# Patient Record
Sex: Female | Born: 1965 | Race: White | Hispanic: No | Marital: Married | Smoking: Current every day smoker
Health system: Southern US, Community
[De-identification: ages and names within clinical notes are randomized; demographics above are authoritative.]

## PROBLEM LIST (undated history)

## (undated) DIAGNOSIS — F419 Anxiety disorder, unspecified: Secondary | ICD-10-CM

## (undated) DIAGNOSIS — E0590 Thyrotoxicosis, unspecified without thyrotoxic crisis or storm: Secondary | ICD-10-CM

## (undated) DIAGNOSIS — K859 Acute pancreatitis without necrosis or infection, unspecified: Secondary | ICD-10-CM

## (undated) DIAGNOSIS — I10 Essential (primary) hypertension: Secondary | ICD-10-CM

## (undated) DIAGNOSIS — E7800 Pure hypercholesterolemia, unspecified: Secondary | ICD-10-CM

## (undated) DIAGNOSIS — E079 Disorder of thyroid, unspecified: Secondary | ICD-10-CM

## (undated) DIAGNOSIS — R011 Cardiac murmur, unspecified: Secondary | ICD-10-CM

## (undated) DIAGNOSIS — G4730 Sleep apnea, unspecified: Secondary | ICD-10-CM

## (undated) DIAGNOSIS — C569 Malignant neoplasm of unspecified ovary: Secondary | ICD-10-CM

## (undated) DIAGNOSIS — I6529 Occlusion and stenosis of unspecified carotid artery: Secondary | ICD-10-CM

## (undated) DIAGNOSIS — R739 Hyperglycemia, unspecified: Secondary | ICD-10-CM

## (undated) HISTORY — PX: ABDOMINAL HYSTERECTOMY: SHX81

## (undated) HISTORY — PX: ABSCESS DRAINAGE: SHX1119

---

## 1989-06-09 DIAGNOSIS — C569 Malignant neoplasm of unspecified ovary: Secondary | ICD-10-CM

## 1989-06-09 HISTORY — DX: Malignant neoplasm of unspecified ovary: C56.9

## 2005-02-13 ENCOUNTER — Emergency Department: Payer: Self-pay | Admitting: Emergency Medicine

## 2005-02-13 ENCOUNTER — Other Ambulatory Visit: Payer: Self-pay

## 2005-02-21 ENCOUNTER — Ambulatory Visit: Payer: Self-pay | Admitting: Internal Medicine

## 2005-12-05 ENCOUNTER — Other Ambulatory Visit: Payer: Self-pay

## 2005-12-12 ENCOUNTER — Ambulatory Visit: Payer: Self-pay | Admitting: Obstetrics and Gynecology

## 2005-12-15 ENCOUNTER — Emergency Department: Payer: Self-pay | Admitting: Obstetrics and Gynecology

## 2006-01-12 ENCOUNTER — Ambulatory Visit: Payer: Self-pay | Admitting: Obstetrics and Gynecology

## 2006-10-11 ENCOUNTER — Emergency Department: Payer: Self-pay | Admitting: Emergency Medicine

## 2006-11-21 ENCOUNTER — Ambulatory Visit: Payer: Self-pay | Admitting: Emergency Medicine

## 2007-01-20 ENCOUNTER — Ambulatory Visit: Payer: Self-pay | Admitting: Emergency Medicine

## 2008-04-02 ENCOUNTER — Ambulatory Visit: Payer: Self-pay | Admitting: Family Medicine

## 2009-11-30 ENCOUNTER — Emergency Department: Payer: Self-pay | Admitting: Internal Medicine

## 2010-06-13 ENCOUNTER — Ambulatory Visit: Payer: Self-pay | Admitting: Internal Medicine

## 2010-11-22 ENCOUNTER — Ambulatory Visit: Payer: Self-pay | Admitting: Cardiology

## 2010-12-20 DIAGNOSIS — I10 Essential (primary) hypertension: Secondary | ICD-10-CM | POA: Insufficient documentation

## 2010-12-20 DIAGNOSIS — E785 Hyperlipidemia, unspecified: Secondary | ICD-10-CM | POA: Insufficient documentation

## 2010-12-20 DIAGNOSIS — Z8543 Personal history of malignant neoplasm of ovary: Secondary | ICD-10-CM | POA: Insufficient documentation

## 2010-12-20 DIAGNOSIS — N6009 Solitary cyst of unspecified breast: Secondary | ICD-10-CM | POA: Insufficient documentation

## 2011-05-06 ENCOUNTER — Emergency Department: Payer: Self-pay | Admitting: Emergency Medicine

## 2011-05-15 ENCOUNTER — Ambulatory Visit: Payer: Self-pay | Admitting: Cardiology

## 2011-05-26 ENCOUNTER — Ambulatory Visit: Payer: Self-pay

## 2011-06-18 ENCOUNTER — Ambulatory Visit: Payer: Self-pay

## 2011-07-15 DIAGNOSIS — E039 Hypothyroidism, unspecified: Secondary | ICD-10-CM | POA: Insufficient documentation

## 2011-07-30 ENCOUNTER — Encounter (HOSPITAL_COMMUNITY): Payer: Self-pay | Admitting: *Deleted

## 2011-07-30 ENCOUNTER — Encounter (HOSPITAL_COMMUNITY): Payer: Self-pay | Admitting: Emergency Medicine

## 2011-07-30 ENCOUNTER — Emergency Department (HOSPITAL_COMMUNITY)
Admission: EM | Admit: 2011-07-30 | Discharge: 2011-07-30 | Disposition: A | Discharge status: Other Acute Inpatient Hospital | Payer: 59 | Attending: Emergency Medicine | Admitting: Emergency Medicine

## 2011-07-30 ENCOUNTER — Inpatient Hospital Stay (HOSPITAL_COMMUNITY)
Admission: RE | Admit: 2011-07-30 | Discharge: 2011-08-01 | DRG: 885 | Disposition: A | Discharge status: Home / Self Care | Payer: 59 | Attending: Psychiatry | Admitting: Psychiatry

## 2011-07-30 DIAGNOSIS — F419 Anxiety disorder, unspecified: Secondary | ICD-10-CM

## 2011-07-30 DIAGNOSIS — F411 Generalized anxiety disorder: Secondary | ICD-10-CM | POA: Insufficient documentation

## 2011-07-30 DIAGNOSIS — I6529 Occlusion and stenosis of unspecified carotid artery: Secondary | ICD-10-CM | POA: Insufficient documentation

## 2011-07-30 DIAGNOSIS — G4730 Sleep apnea, unspecified: Secondary | ICD-10-CM

## 2011-07-30 DIAGNOSIS — F329 Major depressive disorder, single episode, unspecified: Secondary | ICD-10-CM

## 2011-07-30 DIAGNOSIS — I10 Essential (primary) hypertension: Secondary | ICD-10-CM | POA: Insufficient documentation

## 2011-07-30 DIAGNOSIS — R45851 Suicidal ideations: Secondary | ICD-10-CM | POA: Insufficient documentation

## 2011-07-30 DIAGNOSIS — E0590 Thyrotoxicosis, unspecified without thyrotoxic crisis or storm: Secondary | ICD-10-CM

## 2011-07-30 DIAGNOSIS — E079 Disorder of thyroid, unspecified: Secondary | ICD-10-CM | POA: Insufficient documentation

## 2011-07-30 DIAGNOSIS — Z79899 Other long term (current) drug therapy: Secondary | ICD-10-CM

## 2011-07-30 DIAGNOSIS — F339 Major depressive disorder, recurrent, unspecified: Secondary | ICD-10-CM | POA: Diagnosis present

## 2011-07-30 DIAGNOSIS — F341 Dysthymic disorder: Secondary | ICD-10-CM | POA: Insufficient documentation

## 2011-07-30 DIAGNOSIS — F332 Major depressive disorder, recurrent severe without psychotic features: Principal | ICD-10-CM

## 2011-07-30 DIAGNOSIS — Z859 Personal history of malignant neoplasm, unspecified: Secondary | ICD-10-CM

## 2011-07-30 HISTORY — DX: Essential (primary) hypertension: I10

## 2011-07-30 HISTORY — DX: Cardiac murmur, unspecified: R01.1

## 2011-07-30 HISTORY — DX: Sleep apnea, unspecified: G47.30

## 2011-07-30 HISTORY — DX: Occlusion and stenosis of unspecified carotid artery: I65.29

## 2011-07-30 HISTORY — DX: Anxiety disorder, unspecified: F41.9

## 2011-07-30 HISTORY — DX: Thyrotoxicosis, unspecified without thyrotoxic crisis or storm: E05.90

## 2011-07-30 HISTORY — DX: Disorder of thyroid, unspecified: E07.9

## 2011-07-30 LAB — COMPREHENSIVE METABOLIC PANEL
AST: 25 U/L (ref 0–37)
BUN: 12 mg/dL (ref 6–23)
CO2: 29 mEq/L (ref 19–32)
Calcium: 9.7 mg/dL (ref 8.4–10.5)
Chloride: 101 mEq/L (ref 96–112)
Creatinine, Ser: 0.69 mg/dL (ref 0.50–1.10)
GFR calc Af Amer: >90 mL/min (ref >90)
GFR calc non Af Amer: >90 mL/min (ref >90)
Total Bilirubin: 0.2 mg/dL — ABNORMAL LOW (ref 0.3–1.2)

## 2011-07-30 LAB — CBC
HCT: 43.2 % (ref 36.0–46.0)
MCH: 32 pg (ref 26.0–34.0)
MCV: 92.1 fL (ref 78.0–100.0)
Platelets: 228 10*3/uL (ref 150–400)
RBC: 4.69 MIL/uL (ref 3.87–5.11)
RDW: 13.2 % (ref 11.5–15.5)

## 2011-07-30 LAB — URINALYSIS, ROUTINE W REFLEX MICROSCOPIC
Bilirubin Urine: NEGATIVE
Glucose, UA: NEGATIVE mg/dL
Hgb urine dipstick: NEGATIVE
Specific Gravity, Urine: 1.025 (ref 1.005–1.030)
pH: 6 (ref 5.0–8.0)

## 2011-07-30 LAB — RAPID URINE DRUG SCREEN, HOSP PERFORMED
Cocaine: NOT DETECTED
Opiates: NOT DETECTED
Tetrahydrocannabinol: NOT DETECTED

## 2011-07-30 LAB — ETHANOL: Alcohol, Ethyl (B): <11 mg/dL (ref 0–11)

## 2011-07-30 LAB — ACETAMINOPHEN LEVEL: Acetaminophen (Tylenol), Serum: <15 ug/mL (ref 10–30)

## 2011-07-30 MED ORDER — LEVOTHYROXINE SODIUM 25 MCG PO TABS
25.0000 ug | ORAL_TABLET | Freq: Every day | ORAL | Status: DC
  Administered 2011-07-31 – 2011-08-01 (×2): 25 ug via ORAL
  Filled 2011-07-30 (×4): qty 1

## 2011-07-30 MED ORDER — LORAZEPAM 0.5 MG PO TABS
0.5000 mg | ORAL_TABLET | Freq: Once | ORAL | Status: AC
  Administered 2011-07-30: 0.5 mg via ORAL
  Filled 2011-07-30: qty 1

## 2011-07-30 MED ORDER — MAGNESIUM HYDROXIDE 400 MG/5ML PO SUSP: 30.0000 mL | Freq: Every day | ORAL | Status: DC | PRN

## 2011-07-30 MED ORDER — ALUM & MAG HYDROXIDE-SIMETH 200-200-20 MG/5ML PO SUSP: 30.0000 mL | ORAL | Status: DC | PRN

## 2011-07-30 MED ORDER — CHOLECALCIFEROL 10 MCG (400 UNIT) PO TABS
800.0000 [IU] | ORAL_TABLET | Freq: Every day | ORAL | Status: DC
  Administered 2011-07-31 – 2011-08-01 (×2): 800 [IU] via ORAL
  Filled 2011-07-30 (×4): qty 2

## 2011-07-30 MED ORDER — LISINOPRIL 20 MG PO TABS
20.0000 mg | ORAL_TABLET | Freq: Every day | ORAL | Status: DC
  Administered 2011-07-31 – 2011-08-01 (×2): 20 mg via ORAL
  Filled 2011-07-30 (×3): qty 1

## 2011-07-30 MED ORDER — ALPRAZOLAM 1 MG PO TABS: 1.0000 mg | ORAL_TABLET | Freq: Every day | ORAL | Status: DC | PRN

## 2011-07-30 MED ORDER — ESTRADIOL 0.075 MG/24HR TD PTWK: 0.0750 mg | MEDICATED_PATCH | TRANSDERMAL | Status: DC

## 2011-07-30 MED ORDER — METOPROLOL TARTRATE 25 MG PO TABS
25.0000 mg | ORAL_TABLET | Freq: Every day | ORAL | Status: DC
  Administered 2011-07-30 – 2011-07-31 (×2): 25 mg via ORAL
  Filled 2011-07-30 (×4): qty 1

## 2011-07-30 MED ORDER — ACETAMINOPHEN 325 MG PO TABS: 650.0000 mg | ORAL_TABLET | Freq: Four times a day (QID) | ORAL | Status: DC | PRN

## 2011-07-30 NOTE — Tx Team (Signed)
Initial Interdisciplinary Treatment Plan  PATIENT STRENGTHS: (choose at least two) Ability for insight Active sense of humor Average or above average intelligence Capable of independent living General fund of knowledge Motivation for treatment/growth Physical Health Religious Affiliation Supportive family/friends Work skills  PATIENT STRESSORS: Health problems Marital or family conflict Medication change or noncompliance   PROBLEM LIST: Problem List/Patient Goals Date to be addressed Date deferred Reason deferred Estimated date of resolution  ""Learning to let my kids be on their own" 07/30/11     "Cope with fact that daughter is gay" 07/30/11     "Learning Mercado to cope with stress"            Increased risk for suicide 07/30/11     Depression                         DISCHARGE CRITERIA:  Ability to meet basic life and health needs Adequate post-discharge living arrangements Improved stabilization in mood, thinking, and/or behavior Motivation to continue treatment in a less acute level of care Need for constant or close observation no longer present Safe-care adequate arrangements made Verbal commitment to aftercare and medication compliance  PRELIMINARY DISCHARGE PLAN: Attend aftercare/continuing care group Outpatient therapy Participate in family therapy Return to previous living arrangement Return to previous work or school arrangements  PATIENT/FAMIILY INVOLVEMENT: This treatment plan has been presented to and reviewed with the patient, Erin Mercado, and/or family member.  The patient and family have been given the opportunity to ask questions and make suggestions.  Erin Mercado 07/30/2011, 8:15 PM
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
## 2011-07-30 NOTE — ED Notes (Signed)
Pt arrive to room 

## 2011-07-30 NOTE — ED Notes (Signed)
States she thinks she is having panic attacks, increased depression, tried Prozac did not work for her so she stopped it, takes .25 Xanax BID, has 2 daughters, one has a daughter and the other is pregnant and this is worrying patient, all children and grandchildren are living wl/patient and husband.

## 2011-07-30 NOTE — ED Notes (Signed)
Vital signs stable. 
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
## 2011-07-30 NOTE — BH Assessment (Signed)
Assessment Note   Erin Mercado is an 46 y.o. female. PT PRESENTS WITH INCREASE DEPRESSION , SUICIDAL THOUGHTS, FEELINGS OF HOPELESSNESS, HELPLESSNESS, OVERWHELMNESS & SEVERAL ANXIETY. PT SAYS FOR THE PAST FEW MONTHS SHE HAS BEEN HAVING FREQUENT DIZZY & FAINT FEELING BUT WHEN SHE GOES TO THE ED, THEY ARE UNABLE TO FIND ANYTHING. PT STATES HER PCP FELT THAT IT WAS ANXIETY & PT NEEDED TO GET ON SOME MEDS. PT EXPRESSED THAT SHE WAS NERVIOUS ABOUT BEING PUT ON MEDS CAUSE SHE IS NOT SURE WHAT THE SIDE AFFECTS WILL BE. PT IS EMOTIONAL & TEARFUL. PT STATES SAID SHE WENT TO [facility] ON Saturday AS WELL AS Monday. PT SAID SHE HAD A STRONG SUICIDAL THOUGHT ON YESTERDAY & WANTED TO GO HOME  TO SHOOT SELF. PT TOLD SPOUSE THAT SHE FELT OUT OF CONTROL & CANT TAKE IT ANYMORE. PT SAYS HE MIND IS RACING & SHE DOES NOT WANT TO BE HERE ANYMORE. PT DENIES HI OR AV BUT IS UNABLE TO CONTRACT FOR SAFETY. PT IS HAVING TO HAVE SOMEONE STAY WITH HER AT ALL TIMES. PT WAS RAN BY DR. Dan Humphreys WHO ACCEPTED BUT WANTED PT MEDICALLY CLEARED BEFORE ADMISSION.   Axis I: Anxiety Disorder NOS and Major Depression, single episode Axis II: Deferred Axis III:  Past Medical History  Diagnosis Date  . Hypertension   . Heart murmur   . Carotid artery stenosis, unilateral   . Thyroid disease   . Anxiety   . Sleep apnea    Axis IV: other psychosocial or environmental problems and problems with access to health care services Axis V: 11-20 some danger of hurting self or others possible OR occasionally fails to maintain minimal personal hygiene OR gross impairment in communication  Past Medical History: No past medical history on file.  No past surgical history on file.  Family History: No family history on file.  Social History:  does not have a smoking history on file. She does not have any smokeless tobacco history on file. Her alcohol and drug histories not on file.  Additional Social History:    Allergies: Allergies not on  file  Home Medications:  No current facility-administered medications on file as of 07/30/2011.   No current outpatient prescriptions on file as of 07/30/2011.    OB/GYN Status:  No LMP recorded.  General Assessment Data Location of Assessment: [facility] Assessment Services Living Arrangements: Spouse/significant other;Children;Relatives Can pt return to current living arrangement?: Yes Admission Status: Voluntary Is patient capable of signing voluntary admission?: Yes Transfer from: Home Referral Source: Self/Family/Friend     Risk to self Suicidal Ideation: Yes-Currently Present Suicidal Intent: Yes-Currently Present Is patient at risk for suicide?: Yes Suicidal Plan?: Yes-Currently Present Specify Current Suicidal Plan: TO SHOOT SELF Access to Means: Yes Specify Access to Suicidal Means: GUN What has been your use of drugs/alcohol within the last 12 months?: NA Previous Attempts/Gestures: No How many times?: 0  Other Self Harm Risks: 0 Triggers for Past Attempts: Unpredictable Intentional Self Injurious Behavior: None Family Suicide History: Unknown Recent stressful life event(s): Turmoil (Comment) Persecutory voices/beliefs?: No Depression: Yes Depression Symptoms: Loss of interest in usual pleasures;Tearfulness;Feeling worthless/self pity Substance abuse history and/or treatment for substance abuse?: No Suicide prevention information given to non-admitted patients: Not applicable  Risk to Others Homicidal Ideation: No Thoughts of Harm to Others: No Current Homicidal Intent: No Current Homicidal Plan: No Access to Homicidal Means: No Identified Victim: NA History of harm to others?: No Assessment of Violence: None Noted Violent Behavior  Description: COOPERATIVE, ANXIOUS, TEARFUL EMOTIONAL  Does patient have access to weapons?: No Criminal Charges Pending?: No Does patient have a court date: No  Psychosis Hallucinations: None noted Delusions: None noted  Mental  Status Report Appear/Hygiene: Improved Eye Contact: Good Motor Activity: Freedom of movement Speech: Logical/coherent Level of Consciousness: Alert;Crying Mood: Depressed;Anxious;Anhedonia;Helpless;Sad Affect: Appropriate to circumstance;Depressed;Sad;Anxious;Apprehensive Anxiety Level: Moderate Thought Processes: Coherent;Relevant Judgement: Unimpaired Orientation: Person;Place;Time;Situation Obsessive Compulsive Thoughts/Behaviors: None  Cognitive Functioning Concentration: Decreased Memory: Recent Intact;Remote Intact IQ: Average Insight: Poor Impulse Control: Poor Appetite: Fair Weight Loss: 0  Weight Gain: 0  Sleep: Decreased Total Hours of Sleep: 4  Vegetative Symptoms: None  Prior Inpatient Therapy Prior Inpatient Therapy: No Prior Therapy Dates: NA Prior Therapy Facilty/Provider(s): NA Reason for Treatment: NA  Prior Outpatient Therapy Prior Outpatient Therapy: No Prior Therapy Dates: NA Prior Therapy Facilty/Provider(s): NA Reason for Treatment: NA                     Additional Information 1:1 In Past 12 Months?: No CIRT Risk: No Elopement Risk: No Does patient have medical clearance?: No     Disposition:  Disposition Disposition of Patient: Inpatient treatment program;Referred to (PT WAS ACCEPTED BY DR. Elba Barman PENDING MEDICAL CLEARANCE) Type of inpatient treatment program: Adult  On Site Evaluation by:   Reviewed with Physician:     Waldron Session 07/30/2011 1:47 PM

## 2011-07-30 NOTE — ED Notes (Signed)
Patient denies Si or HI at this time, does not want to go to [facility] or any place w/men and women. Wants to go to a facility for women only, informed that place did not exist, informed [facility] member about this information.

## 2011-07-31 DIAGNOSIS — F339 Major depressive disorder, recurrent, unspecified: Secondary | ICD-10-CM | POA: Diagnosis present

## 2011-07-31 DIAGNOSIS — F332 Major depressive disorder, recurrent severe without psychotic features: Principal | ICD-10-CM

## 2011-07-31 MED ORDER — HYDROXYZINE HCL 25 MG PO TABS: 25.0000 mg | ORAL_TABLET | Freq: Three times a day (TID) | ORAL | Status: DC | PRN

## 2011-07-31 NOTE — H&P (Signed)
Medical/psychiatric screening examination/treatment/procedure(s) were performed by non-physician practitioner and as supervising physician I was immediately available for consultation/collaboration.   I have seen and examined this patient and agree with this evaluation.  

## 2011-07-31 NOTE — Tx Team (Signed)
Interdisciplinary Treatment Plan Update (Adult)  Date:  07/31/2011  Time Reviewed:  10:50 AM   Progress in Treatment: Attending groups: Yes Participating in groups:  Yes Taking medication as prescribed: Yes Tolerating medication:  Yes Family/Significant othe contact made:  Counselor assessing for appropriate contact Patient understands diagnosis:  Yes Discussing patient identified problems/goals with staff:  Yes Medical problems stabilized or resolved:  Yes Denies suicidal/homicidal ideation: Yes Issues/concerns per patient self-inventory:  None identified Other: N/A  New problem(s) identified: None Identified  Reason for Continuation of Hospitalization: Anxiety Depression Medication stabilization Suicidal ideation  Interventions implemented related to continuation of hospitalization: mood stabilization, medication monitoring and adjustment, group therapy and psycho education, safety checks q 15 mins  Additional comments: N/A  Estimated length of stay: 3-5 days  Discharge Plan: SW will seek appropriate referrals in [facility] for medication management and therapy  New goal(s): N/A  Review of initial/current patient goals per problem list:    1.  Goal(s): Reduce depressive symptoms  Met:  No  Target date: by discharge  As evidenced by: Reducing depression from a 10 to a 3 as reported by pt.   2.  Goal (s): Reduce/Eliminate suicidal ideation  Met:  No  Target date: by discharge  As evidenced by: pt reporting no SI.    3.  Goal(s): Reduce anxiety symptoms  Met:  No  Target date: by discharge  As evidenced by: Reduce anxiety from a 10 to a 3 as reported by pt.    Attendees: Patient:  Erin Mercado 07/31/2011 10:51 AM   Family:     Physician:  Orson Aloe, MD  07/31/2011  10:50 AM   Nursing:   Omelia Blackwater, RN 07/31/2011 10:51 AM   Case Manager:  Reyes Ivan, LCSWA 07/31/2011  10:50 AM   Counselor:  Angus Palms, LCSW 07/31/2011  10:50 AM     Other:  Juline Patch, LCSW 07/31/2011  10:50 AM   Other:  Quintella Reichert, RN 07/31/2011  10:50 AM   Other:     Other:      Scribe for Treatment Team:   Carmina Miller, 07/31/2011 , 10:50 AM

## 2011-07-31 NOTE — Progress Notes (Signed)
Patient was pleasant and cooperative during the assessment. Pt states both her daughters ages 38 and 45 are living with her and her husband. The 18yr  Just informed her mother that she believes she's gay. She has a small child and the 41yr is expecting. Pt was started on prozac last week, but states she stopped taking them due to elevated Bp's. States she's been having "dizzy spells" off/on for several years. States, "really bad for the last 4 months".  States she's had neuro consults and all were good. States Dr believes it was anxiety or stress. Support and encouragement was offered.
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
## 2011-07-31 NOTE — Progress Notes (Signed)
Patient's self inventory sheet, patient sleeps fair, has improving appetite, low energy level, good attention span.   Rated depression and hopelessness #5.  Denied SI.  Has experienced lightheadedness and headaches in past 24 hours.  Zero pain goal, worst pain #1.  Plans to do more for herself after discharged instead of others all the time.  No discharge plans.  No problems taking meds after discharge. Has been cooperative and pleasant this morning.

## 2011-07-31 NOTE — Progress Notes (Signed)
Pt attended discharge planning group and actively participated.  Pt presents with flat affect and depressed mood.  Pt was open with sharing reason for entering the hospital.  Pt states that she was overwhelmed with caring for her family.  Pt states that she has an 23 and 44 year old living with her, a 10 year old grandchild and a baby on the way.  Pt states that she also found out her 44 year old may be a lesbian.  Pt states she lives with her family in [place] and has transportation home.  Pt states that she was having a lot of physical symptoms, such as dizziness and couldn't figure out what was wrong.  Pt states that she has been checked out and nothing is found.  Pt states that her husband was concerned about her suicidal thoughts and brought her here.  Pt states that she now realizes her symptoms are anxiety.  Pt ranks depression at a 5 and anxiety at an 8 today.  Pt denies SI today.  Pt states that she has not been treated for depression or anxiety before.  Pt open to referrals.  SW will seek appropriate referrals for pt.  Safety planning and suicide prevention discussed.     Reyes Ivan, LCSWA 07/31/2011  10:38 AM
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
## 2011-08-01 LAB — TSH: TSH: 2.441 u[IU]/mL (ref 0.350–4.500)

## 2011-08-01 LAB — T3, FREE: T3, Free: 3.1 pg/mL (ref 2.3–4.2)

## 2011-08-01 LAB — T4, FREE: Free T4: 1.44 ng/dL (ref 0.80–1.80)

## 2011-08-01 MED ORDER — ESTRADIOL 0.075 MG/24HR TD PTWK: 1.0000 | MEDICATED_PATCH | TRANSDERMAL | Status: AC

## 2011-08-01 MED ORDER — VITAMIN D 400 UNITS PO CAPS: 800.0000 [IU] | ORAL_CAPSULE | Freq: Every day | ORAL | Status: DC

## 2011-08-01 MED ORDER — METOPROLOL TARTRATE 25 MG PO TABS: 25.0000 mg | ORAL_TABLET | Freq: Every day | ORAL | Status: DC

## 2011-08-01 MED ORDER — HYDROXYZINE HCL 25 MG PO TABS: 25.0000 mg | ORAL_TABLET | Freq: Three times a day (TID) | ORAL | Status: AC | PRN

## 2011-08-01 MED ORDER — LEVOTHYROXINE SODIUM 25 MCG PO TABS: 25.0000 ug | ORAL_TABLET | Freq: Every day | ORAL | Status: DC

## 2011-08-01 MED ORDER — LISINOPRIL 20 MG PO TABS: 20.0000 mg | ORAL_TABLET | Freq: Every day | ORAL | Status: DC

## 2011-08-01 NOTE — Progress Notes (Signed)
Patient ID: Erin Mercado, female   DOB: 08/30/1965, 46 y.o.   MRN: [id number] Patient has order for d/c and denies any SI. Verbalizes understanding of follow up and medications. Appears ready to d/c.

## 2011-08-01 NOTE — Progress Notes (Signed)
[facility] Group Notes:  (Counselor/Nursing/MHT/Case Management/Adjunct)  08/01/2011 11:00AM  Type of Therapy:  Group Therapy  Participation Level:  Active  Participation Quality:  Appropriate, Attentive, Sharing and Supportive  Affect:  Appropriate  Cognitive:  Alert and Oriented  Insight:  Good  Engagement in Group:  Good  Engagement in Therapy:  Good  Modes of Intervention:  Clarification, Problem-solving, Support and Exploratory  Summary of Progress/Problems: Pt was able to discuss the two emotions that she has been experiencing which are stress and anxiety. Pt stated that one of her triggers for her stress and anxiety are her children. Pt was able to recognize that she needs to learn to say no and to communicate more. Pt reports that her husband is very supportive at this time. Pt stated her plan is to learn how to communicate more with her family.  Liliann File, Randal Buba 08/01/2011, 12:19 PM

## 2011-08-01 NOTE — [facility] Suicide Risk Assessment (Signed)
Suicide Risk Assessment  Discharge Assessment     Demographic factors:  Assessment Details Time of Assessment: Admission Information Obtained From: Patient Current Mental Status:    Risk Reduction Factors:  Risk Reduction Factors: Responsible for children under 44 years of age;Sense of responsibility to family;Religious beliefs about death;Living with another person, especially a relative;Positive social support;Positive therapeutic relationship;Positive coping skills or problem solving skills  CLINICAL FACTORS:   Severe Anxiety and/or Agitation Previous Psychiatric Diagnoses and Treatments  COGNITIVE FEATURES THAT CONTRIBUTE TO RISK:  Thought constriction (tunnel vision)    SUICIDE RISK:   Minimal: No identifiable suicidal ideation.  Patients presenting with no risk factors but with morbid ruminations; may be classified as minimal risk based on the severity of the depressive symptoms  ADL's:  Intact  Sleep: Good  Appetite:  Good  Suicidal Ideation:  Denies adamantly any suicidal thoughts. Homicidal Ideation:  Denies adamantly any homicidal thoughts.  Mental Status Examination/Evaluation: Objective:  Appearance: Casual  Eye Contact::  Good  Speech:  Clear and Coherent  Volume:  Normal  Mood:  Euthymic  Affect:  Congruent  Thought Process:  Coherent  Orientation:  Full  Thought Content:  WDL  Suicidal Thoughts:  No  Homicidal Thoughts:  No  Memory:  Immediate;   Good  Judgement:  Good  Insight:  Good  Psychomotor Activity:  Normal  Concentration:  Good  Recall:  Good  Akathisia:  No  AIMS (if indicated):     Assets:  Communication Skills Desire for Improvement Financial Resources/Insurance  Sleep:     Vital Signs: Blood pressure 116/79, pulse 62, temperature 97.7 F (36.5 C), temperature source Oral, resp. rate 16, last menstrual period 07/29/2001.  Labs Results for orders placed during the hospital encounter of 07/30/11 (from the past 48 hour(s))  T3, FREE      Status: Normal   Collection Time   07/31/11  8:09 PM      Component Value Range Comment   T3, Free 3.1  2.3 - 4.2 (pg/mL)   T4, FREE     Status: Normal   Collection Time   07/31/11  8:09 PM      Component Value Range Comment   Free T4 1.44  0.80 - 1.80 (ng/dL)   TSH     Status: Normal   Collection Time   07/31/11  8:09 PM      Component Value Range Comment   TSH 2.441  0.350 - 4.500 (uIU/mL)     What pt has learned from hospital stay is learning to accept that I do have a problem.  Risk of self harm is minimal in that she has learned that she needs to change and then be stronger.  She still sees herself and her husband as reason to live.  Risk of harm to others is minimal in that she has not been involved in fights or had any legal charges filed on her.  PLAN: Discharge home Continue Medication List  As of 08/01/2011 10:31 AM   STOP taking these medications         ALPRAZolam 0.5 MG tablet      metoprolol succinate 50 MG 24 hr tablet         TAKE these medications         estradiol 0.075 mg/24hr   Commonly known as: CLIMARA - Dosed in mg/24 hr   Place 1 patch (0.075 mg total) onto the skin once a week. For estrogen replacement. SATURDAY  hydrOXYzine 25 MG tablet   Commonly known as: ATARAX/VISTARIL   Take 1 tablet (25 mg total) by mouth 3 (three) times daily as needed for anxiety.      levothyroxine 25 MCG tablet   Commonly known as: SYNTHROID, LEVOTHROID   Take 1 tablet (25 mcg total) by mouth daily. For thyroid replacement      lisinopril 20 MG tablet   Commonly known as: PRINIVIL,ZESTRIL   Take 1 tablet (20 mg total) by mouth daily. For control of high blood pressure      metoprolol tartrate 25 MG tablet   Commonly known as: LOPRESSOR   Take 1 tablet (25 mg total) by mouth at bedtime. For control of high blood pressure      Vitamin D 400 UNITS capsule   Take 2 capsules (800 Units total) by mouth daily. For Vitamin D replacement and mood control.             Tyara Dassow 08/01/2011, 10:31 AM

## 2011-08-01 NOTE — Tx Team (Signed)
Interdisciplinary Treatment Plan Update (Adult)  Date:  08/01/2011  Time Reviewed:  10:36 AM   Progress in Treatment: Attending groups: Yes Participating in groups:  Yes Taking medication as prescribed: Yes Tolerating medication:  Yes Family/Significant othe contact made: Counselor will make contact with pt's husband today   Patient understands diagnosis:  Yes Discussing patient identified problems/goals with staff:  Yes Medical problems stabilized or resolved:  Yes Denies suicidal/homicidal ideation: Yes Issues/concerns per patient self-inventory:  None identified Other: N/A  New problem(s) identified: None Identified  Reason for Continuation of Hospitalization: Stable to d/c  Interventions implemented related to continuation of hospitalization: Stable to d/c  Additional comments: N/A  Estimated length of stay: D/C today  Discharge Plan: Pt will follow up at [facility] for medication management and therapy.    New goal(s): N/A  Review of initial/current patient goals per problem list:    1.  Goal(s): Reduce depressive symptoms  Met:  Yes  Target date: by discharge  As evidenced by: Reducing depression from a 10 to a 3 as reported by pt. Pt ranks at a 2 today.   2.  Goal (s): Reduce/Eliminate suicidal ideation  Met:  Yes  Target date: by discharge  As evidenced by: pt reporting no SI.    3.  Goal(s): Reduce anxiety symptoms  Met:  Yes  Target date: by discharge  As evidenced by: Reduce anxiety from a 10 to a 3 as reported by pt.  Pt ranks at a 2 today.    Attendees: Patient:  Erin Mercado 08/01/2011 10:38 AM   Family:     Physician:  Orson Aloe, MD  08/01/2011  10:36 AM   Nursing:   Omelia Blackwater, RN 08/01/2011 10:38 AM   Case Manager:  Reyes Ivan, LCSWA 08/01/2011  10:36 AM   Counselor:  Angus Palms, LCSW 08/01/2011  10:36 AM   Other:  Juline Patch, LCSW 08/01/2011  10:36 AM   Other:  Wilmon Arms, SW intern 08/01/2011  10:36 AM    Other:  Magdalen Spatz, counseling intern 08/01/2011 10:38 AM   Other:      Scribe for Treatment Team:   Carmina Miller, 08/01/2011 , 10:36 AM

## 2011-08-01 NOTE — Progress Notes (Signed)
[facility] Case Management Discharge Plan:  Will you be returning to the same living situation after discharge: Yes,  pt returning home At discharge, do you have transportation home?:Yes,  pt has transportation home Do you have the ability to pay for your medications:Yes,  access to meds   Release of information consent forms completed and in the chart;  Patient's signature needed at discharge.  Patient to Follow up at:  Follow-up Information    Follow up with [facility] - Rob Maggard for therapy on 08/05/2011. (Appointment scheduled at 3:00 pm (Arrive at 2:30 pm to  fill out paperwork))    Contact information:   [address] [place], [place] [address] Phone: [phone] Fax: [phone]      Follow up with [facility] - Dr. Terrilee Croak on 08/12/2011. (Appointment scheduled at 12:45 pm )    Contact information:   [address] [place], [place] [address] Phone: [phone] Fax: [phone]         Patient denies SI/HI:   Yes,  denies today    Safety Planning and Suicide Prevention discussed:  Yes,  discussed with pt  Barrier to discharge identified:No.  Summary and Recommendations: Pt attended discharge planning group and actively participated.  Pt presents with calm mood and affect.  Pt reports feeling stable to d/c today.  Pt ranks depression and anxiety at a 2 today.  Pt discussed setting boundaries and learning to tell her kids no.  Pt also discussed accepting that she has a mental disorder and it can be treated.  No recommendations from SW.  No further needs voiced by pt.  Pt stable to discharge.     Carmina Miller 08/01/2011, 12:16 PM

## 2011-08-01 NOTE — Progress Notes (Signed)
[facility] Adult Inpatient Family/Significant Other Suicide Prevention Education  Suicide Prevention Education:  Education Completed; Erin Mercado, husband, [phone]) has been identified by the patient as the family member/significant other with whom the patient will be residing, and identified as the person(s) who will aid the patient in the event of a mental health crisis (suicidal ideations/suicide attempt).  With written consent from the patient, the family member/significant other has been provided the following suicide prevention education, prior to the and/or following the discharge of the patient.  The suicide prevention education provided includes the following:  Suicide risk factors  Suicide prevention and interventions  National Suicide Hotline telephone number  [facility] assessment telephone number  [facility] Emergency Assistance 911  [facility] and/or Residential Mobile Crisis Unit telephone number  Request made of family/significant other to:  Remove weapons (e.g., guns, rifles, knives), all items previously/currently identified as safety concern.    Remove drugs/medications (over-the-counter, prescriptions, illicit drugs), all items previously/currently identified as a safety concern.  Erin Mercado reported that he does not have any safety concerns for Erin Mercado being discharged. He stated that he has removed all guns from the home, and she no longer has access to weapons. Erin Mercado verbalized understanding of suicide prevention information and had no further questions. He stated he is very interested in both [place] and the family getting counseling.   Erin Mercado 08/01/2011, 1:52 PM

## 2011-08-01 NOTE — Progress Notes (Signed)
Pt has been pleasant and cooperative this evening. Observed interacting well on the unit. Reports feeling mildly depressed this evening, but overall has decreased since admission. Pt attended Karaoke this evening and reported to the med window for meds this evening. Pt is currently denying any SI. Support and availability as needed has been extended to this pt. Pt safety remains with q68min checks.

## 2011-08-01 NOTE — Discharge Summary (Signed)
Physician Discharge Summary Note  Patient:  Erin Mercado is an 46 y.o., female MRN:  [id number] DOB:  Oct 08, 1965 Patient phone:  [phone] (home)  Patient address:   [address] [place] [address],   Date of Admission:  07/30/2011 Date of Discharge: 08/01/2011  Discharge Diagnoses: Principal Problem:  *Major depressive disorder, recurrent episode   Axis Diagnosis:   AXIS I:  Major Depression, Recurrent. AXIS II:  No diagnosis  AXIS III:  See below Past Medical History  Diagnosis Date  . Hypertension   . Heart murmur   . Carotid artery stenosis, unilateral   . Thyroid disease   . Anxiety   . Sleep apnea   . Hyperthyroidism   . Cancer    AXIS IV:  Chronic domestic and parenting stressors AXIS V:  51-60 moderate symptoms  Level of Care:  OP  Hospital Course:  This is the first admission for [facility] who presented in our emergency room complaining of suicidal thoughts and just feeling anxious. She reported being very overwhelmed by multiple family stressors, many people in the home or not contributing to helping household Ron. She told her spouse the day before that she felt out of control and had thoughts of going home and shooting herself. Get a history of chronic depression but no previous suicide attempts. Family history was positive for a grandfather who had attempted suicide.  She was admitted for mood disorders program, and started on Vistaril to control her anxiety and Xanax was discontinued. She was cooperative and pleasant on the unit and appropriate with peers and staff. Her group participation was satisfactory in our counselors were in touch with her family. By the 21st she was ready for discharge with no dangerous ideas and agreed to followup with outpatient counseling.  Consults:  None  Significant Diagnostic Studies:  TSH 2.441, Free T4 1.44.  UDS and alcohol screens negative.   Discharge Vitals:   Blood pressure 116/79, pulse 62, temperature 97.7 F  (36.5 C), temperature source Oral, resp. rate 16, last menstrual period 07/29/2001.  Mental Status Exam: See Mental Status Examination and Suicide Risk Assessment completed by Attending Physician prior to discharge.  Discharge destination:  Home  Is patient on multiple antipsychotic therapies at discharge:  No   Has Patient had three or more failed trials of antipsychotic monotherapy by history:  No  Recommended Plan for Multiple Antipsychotic Therapies: n/a   Medication List  As of 08/01/2011  1:14 PM   STOP taking these medications         ALPRAZolam 0.5 MG tablet      metoprolol succinate 50 MG 24 hr tablet         TAKE these medications      Indication    estradiol 0.075 mg/24hr   Commonly known as: CLIMARA - Dosed in mg/24 hr   Place 1 patch (0.075 mg total) onto the skin once a week. For estrogen replacement. SATURDAY       hydrOXYzine 25 MG tablet   Commonly known as: ATARAX/VISTARIL   Take 1 tablet (25 mg total) by mouth 3 (three) times daily as needed for anxiety.       levothyroxine 25 MCG tablet   Commonly known as: SYNTHROID, LEVOTHROID   Take 1 tablet (25 mcg total) by mouth daily. For thyroid replacement       lisinopril 20 MG tablet   Commonly known as: PRINIVIL,ZESTRIL   Take 1 tablet (20 mg total) by mouth daily. For control of  high blood pressure       metoprolol tartrate 25 MG tablet   Commonly known as: LOPRESSOR   Take 1 tablet (25 mg total) by mouth at bedtime. For control of high blood pressure       Vitamin D 400 UNITS capsule   Take 2 capsules (800 Units total) by mouth daily. For Vitamin D replacement and mood control.            Follow-up Information    Follow up with [facility] - Rob Maggard for therapy on 08/05/2011. (Appointment scheduled at 3:00 pm (Arrive at 2:30 pm to  fill out paperwork))    Contact information:   [address] [place], [place] [address] Phone: [phone] Fax: [phone]      Follow up with  [facility] - Dr. Terrilee Croak on 08/12/2011. (Appointment scheduled at 12:45 pm )    Contact information:   [address] [place], [place] [address] Phone: [phone] Fax: [phone]         Follow-up recommendations:  Activity:  unrestricted Diet:  regular  Signed: Lakia Gritton A 08/01/2011, 1:14 PM

## 2011-08-06 NOTE — Progress Notes (Signed)
Patient Discharge Instructions:  Admission Note Faxed,  08/06/2011 After Visit Summary Faxed,  08/06/2011 Faxed to the Next Level Care provider:  08/06/2011 D/C Summary Note faxed 08/06/2011 Facesheet faxed 08/06/2011  Fax unable to transmit after multiple attempts, sent via certified mail to [facility] @ [address] [place], [place] [address]  Wandra Scot, 08/06/2011, 11:02 AM

## 2011-08-06 NOTE — Discharge Summary (Signed)
I agree with this D/C Summary.  

## 2012-03-02 DIAGNOSIS — F419 Anxiety disorder, unspecified: Secondary | ICD-10-CM | POA: Insufficient documentation

## 2012-03-02 DIAGNOSIS — G4730 Sleep apnea, unspecified: Secondary | ICD-10-CM | POA: Insufficient documentation

## 2012-03-02 DIAGNOSIS — R011 Cardiac murmur, unspecified: Secondary | ICD-10-CM | POA: Insufficient documentation

## 2012-03-02 DIAGNOSIS — I499 Cardiac arrhythmia, unspecified: Secondary | ICD-10-CM | POA: Insufficient documentation

## 2012-03-02 DIAGNOSIS — E559 Vitamin D deficiency, unspecified: Secondary | ICD-10-CM | POA: Insufficient documentation

## 2012-03-02 DIAGNOSIS — L732 Hidradenitis suppurativa: Secondary | ICD-10-CM | POA: Insufficient documentation

## 2012-03-02 DIAGNOSIS — Z9114 Patient's other noncompliance with medication regimen: Secondary | ICD-10-CM | POA: Insufficient documentation

## 2012-03-02 DIAGNOSIS — Z8639 Personal history of other endocrine, nutritional and metabolic disease: Secondary | ICD-10-CM | POA: Insufficient documentation

## 2012-03-02 DIAGNOSIS — R0989 Other specified symptoms and signs involving the circulatory and respiratory systems: Secondary | ICD-10-CM | POA: Insufficient documentation

## 2012-06-11 ENCOUNTER — Ambulatory Visit: Payer: Self-pay

## 2012-07-12 ENCOUNTER — Ambulatory Visit: Payer: Self-pay | Admitting: Vascular Surgery

## 2013-07-12 ENCOUNTER — Ambulatory Visit: Payer: Self-pay | Admitting: Physician Assistant

## 2013-07-12 LAB — RAPID STREP-A WITH REFLX: MICRO TEXT REPORT: NEGATIVE

## 2013-07-15 LAB — BETA STREP CULTURE(ARMC)

## 2014-04-13 ENCOUNTER — Ambulatory Visit: Payer: Self-pay | Admitting: Family Medicine

## 2014-05-29 ENCOUNTER — Ambulatory Visit: Payer: Self-pay | Admitting: Physician Assistant

## 2014-05-29 LAB — RAPID STREP-A WITH REFLX: MICRO TEXT REPORT: NEGATIVE

## 2014-06-01 LAB — BETA STREP CULTURE(ARMC)

## 2014-09-24 DIAGNOSIS — N2889 Other specified disorders of kidney and ureter: Secondary | ICD-10-CM | POA: Insufficient documentation

## 2014-12-05 ENCOUNTER — Encounter: Payer: Self-pay | Admitting: Emergency Medicine

## 2014-12-05 ENCOUNTER — Emergency Department: Payer: 59

## 2014-12-05 ENCOUNTER — Inpatient Hospital Stay
Admission: EM | Admit: 2014-12-05 | Discharge: 2014-12-06 | DRG: 440 | Disposition: A | Discharge status: Home / Self Care | Payer: 59 | Attending: Internal Medicine | Admitting: Internal Medicine

## 2014-12-05 DIAGNOSIS — F419 Anxiety disorder, unspecified: Secondary | ICD-10-CM | POA: Diagnosis present

## 2014-12-05 DIAGNOSIS — E0590 Thyrotoxicosis, unspecified without thyrotoxic crisis or storm: Secondary | ICD-10-CM | POA: Diagnosis present

## 2014-12-05 DIAGNOSIS — Z79899 Other long term (current) drug therapy: Secondary | ICD-10-CM

## 2014-12-05 DIAGNOSIS — E785 Hyperlipidemia, unspecified: Secondary | ICD-10-CM | POA: Diagnosis present

## 2014-12-05 DIAGNOSIS — N289 Disorder of kidney and ureter, unspecified: Secondary | ICD-10-CM | POA: Diagnosis present

## 2014-12-05 DIAGNOSIS — E780 Pure hypercholesterolemia: Secondary | ICD-10-CM | POA: Diagnosis present

## 2014-12-05 DIAGNOSIS — K859 Acute pancreatitis without necrosis or infection, unspecified: Secondary | ICD-10-CM | POA: Diagnosis present

## 2014-12-05 DIAGNOSIS — Z8543 Personal history of malignant neoplasm of ovary: Secondary | ICD-10-CM | POA: Diagnosis not present

## 2014-12-05 DIAGNOSIS — G4730 Sleep apnea, unspecified: Secondary | ICD-10-CM | POA: Diagnosis present

## 2014-12-05 DIAGNOSIS — R011 Cardiac murmur, unspecified: Secondary | ICD-10-CM | POA: Diagnosis present

## 2014-12-05 DIAGNOSIS — K529 Noninfective gastroenteritis and colitis, unspecified: Secondary | ICD-10-CM | POA: Insufficient documentation

## 2014-12-05 DIAGNOSIS — Z8249 Family history of ischemic heart disease and other diseases of the circulatory system: Secondary | ICD-10-CM | POA: Diagnosis not present

## 2014-12-05 DIAGNOSIS — E039 Hypothyroidism, unspecified: Secondary | ICD-10-CM | POA: Diagnosis present

## 2014-12-05 DIAGNOSIS — Z90710 Acquired absence of both cervix and uterus: Secondary | ICD-10-CM

## 2014-12-05 DIAGNOSIS — Z833 Family history of diabetes mellitus: Secondary | ICD-10-CM

## 2014-12-05 DIAGNOSIS — R52 Pain, unspecified: Secondary | ICD-10-CM

## 2014-12-05 DIAGNOSIS — K760 Fatty (change of) liver, not elsewhere classified: Secondary | ICD-10-CM | POA: Diagnosis present

## 2014-12-05 DIAGNOSIS — I10 Essential (primary) hypertension: Secondary | ICD-10-CM | POA: Diagnosis present

## 2014-12-05 DIAGNOSIS — R197 Diarrhea, unspecified: Secondary | ICD-10-CM | POA: Diagnosis present

## 2014-12-05 DIAGNOSIS — R748 Abnormal levels of other serum enzymes: Secondary | ICD-10-CM | POA: Diagnosis present

## 2014-12-05 DIAGNOSIS — F17210 Nicotine dependence, cigarettes, uncomplicated: Secondary | ICD-10-CM | POA: Diagnosis present

## 2014-12-05 DIAGNOSIS — R1013 Epigastric pain: Secondary | ICD-10-CM | POA: Diagnosis present

## 2014-12-05 HISTORY — DX: Hyperglycemia, unspecified: R73.9

## 2014-12-05 HISTORY — DX: Pure hypercholesterolemia, unspecified: E78.00

## 2014-12-05 HISTORY — DX: Malignant neoplasm of unspecified ovary: C56.9

## 2014-12-05 LAB — CBC WITH DIFFERENTIAL/PLATELET
BASOS PCT: 1 %
Basophils Absolute: 0.1 10*3/uL (ref 0–0.1)
EOS ABS: 0.7 10*3/uL (ref 0–0.7)
Eosinophils Relative: 5 %
HCT: 48.4 % — ABNORMAL HIGH (ref 35.0–47.0)
HEMOGLOBIN: 16.2 g/dL — AB (ref 12.0–16.0)
Lymphocytes Relative: 35 %
Lymphs Abs: 4.2 10*3/uL — ABNORMAL HIGH (ref 1.0–3.6)
MCH: 31 pg (ref 26.0–34.0)
MCHC: 33.5 g/dL (ref 32.0–36.0)
MCV: 92.4 fL (ref 80.0–100.0)
MONOS PCT: 4 %
Monocytes Absolute: 0.5 10*3/uL (ref 0.2–0.9)
NEUTROS ABS: 6.8 10*3/uL — AB (ref 1.4–6.5)
NEUTROS PCT: 55 %
PLATELETS: 211 10*3/uL (ref 150–440)
RBC: 5.23 MIL/uL — AB (ref 3.80–5.20)
RDW: 14 % (ref 11.5–14.5)
WBC: 12.3 10*3/uL — ABNORMAL HIGH (ref 3.6–11.0)

## 2014-12-05 LAB — URINALYSIS COMPLETE WITH MICROSCOPIC (ARMC ONLY)
BACTERIA UA: NONE SEEN
BILIRUBIN URINE: NEGATIVE
GLUCOSE, UA: NEGATIVE mg/dL
Hgb urine dipstick: NEGATIVE
KETONES UR: NEGATIVE mg/dL
LEUKOCYTES UA: NEGATIVE
NITRITE: NEGATIVE
Protein, ur: NEGATIVE mg/dL
Specific Gravity, Urine: 1.012 (ref 1.005–1.030)
pH: 5 (ref 5.0–8.0)

## 2014-12-05 LAB — LIPASE, BLOOD: LIPASE: 261 U/L — AB (ref 22–51)

## 2014-12-05 LAB — COMPREHENSIVE METABOLIC PANEL
ALBUMIN: 4.2 g/dL (ref 3.5–5.0)
ALT: 28 U/L (ref 14–54)
AST: 23 U/L (ref 15–41)
Alkaline Phosphatase: 70 U/L (ref 38–126)
Anion gap: 9 (ref 5–15)
BUN: 10 mg/dL (ref 6–20)
CALCIUM: 9.3 mg/dL (ref 8.9–10.3)
CO2: 26 mmol/L (ref 22–32)
Chloride: 102 mmol/L (ref 101–111)
Creatinine, Ser: 0.7 mg/dL (ref 0.44–1.00)
GFR calc Af Amer: >60 mL/min (ref >60)
Glucose, Bld: 113 mg/dL — ABNORMAL HIGH (ref 65–99)
Potassium: 4 mmol/L (ref 3.5–5.1)
SODIUM: 137 mmol/L (ref 135–145)
Total Bilirubin: 0.5 mg/dL (ref 0.3–1.2)
Total Protein: 7.9 g/dL (ref 6.5–8.1)

## 2014-12-05 LAB — TSH: TSH: 6.257 u[IU]/mL — ABNORMAL HIGH (ref 0.350–4.500)

## 2014-12-05 LAB — TROPONIN I: Troponin I: <0.03 ng/mL (ref <0.031)

## 2014-12-05 MED ORDER — ONDANSETRON HCL 4 MG/2ML IJ SOLN: 4.0000 mg | Freq: Four times a day (QID) | INTRAMUSCULAR | Status: DC | PRN

## 2014-12-05 MED ORDER — ONDANSETRON HCL 4 MG PO TABS: 4.0000 mg | ORAL_TABLET | Freq: Four times a day (QID) | ORAL | Status: DC | PRN

## 2014-12-05 MED ORDER — ACETAMINOPHEN 650 MG RE SUPP: 650.0000 mg | Freq: Four times a day (QID) | RECTAL | Status: DC | PRN

## 2014-12-05 MED ORDER — HYDROCODONE-ACETAMINOPHEN 5-325 MG PO TABS
1.0000 | ORAL_TABLET | ORAL | Status: DC | PRN
  Administered 2014-12-05 – 2014-12-06 (×4): 1 via ORAL
  Filled 2014-12-05: qty 2
  Filled 2014-12-05 (×3): qty 1

## 2014-12-05 MED ORDER — ALUM & MAG HYDROXIDE-SIMETH 200-200-20 MG/5ML PO SUSP: 30.0000 mL | Freq: Four times a day (QID) | ORAL | Status: DC | PRN

## 2014-12-05 MED ORDER — ONDANSETRON HCL 4 MG/2ML IJ SOLN
INTRAMUSCULAR | Status: AC
  Administered 2014-12-05: 4 mg via INTRAVENOUS
  Filled 2014-12-05: qty 2

## 2014-12-05 MED ORDER — SODIUM CHLORIDE 0.9 % IV SOLN
INTRAVENOUS | Status: DC
  Administered 2014-12-05 – 2014-12-06 (×2): via INTRAVENOUS

## 2014-12-05 MED ORDER — HEPARIN SODIUM (PORCINE) 5000 UNIT/ML IJ SOLN
5000.0000 [IU] | Freq: Three times a day (TID) | INTRAMUSCULAR | Status: DC
  Administered 2014-12-05: 5000 [IU] via SUBCUTANEOUS
  Filled 2014-12-05: qty 1

## 2014-12-05 MED ORDER — SERTRALINE HCL 50 MG PO TABS
50.0000 mg | ORAL_TABLET | Freq: Every day | ORAL | Status: DC
  Administered 2014-12-06: 50 mg via ORAL
  Filled 2014-12-05: qty 1

## 2014-12-05 MED ORDER — METOPROLOL TARTRATE 25 MG PO TABS
25.0000 mg | ORAL_TABLET | Freq: Every day | ORAL | Status: DC
  Administered 2014-12-05: 25 mg via ORAL
  Filled 2014-12-05: qty 1

## 2014-12-05 MED ORDER — TRAZODONE HCL 50 MG PO TABS: 25.0000 mg | ORAL_TABLET | Freq: Every evening | ORAL | Status: DC | PRN

## 2014-12-05 MED ORDER — ALPRAZOLAM 0.25 MG PO TABS
0.2500 mg | ORAL_TABLET | Freq: Every day | ORAL | Status: DC | PRN
Start: 2014-12-05 — End: 2014-12-06

## 2014-12-05 MED ORDER — MORPHINE SULFATE 4 MG/ML IJ SOLN
INTRAMUSCULAR | Status: AC
  Administered 2014-12-05: 4 mg via INTRAVENOUS
  Filled 2014-12-05: qty 1

## 2014-12-05 MED ORDER — MORPHINE SULFATE 4 MG/ML IJ SOLN
4.0000 mg | Freq: Once | INTRAMUSCULAR | Status: AC
  Administered 2014-12-05: 4 mg via INTRAVENOUS

## 2014-12-05 MED ORDER — LISINOPRIL 20 MG PO TABS
40.0000 mg | ORAL_TABLET | Freq: Every day | ORAL | Status: DC
  Administered 2014-12-06: 40 mg via ORAL
  Filled 2014-12-05: qty 2
  Filled 2014-12-05: qty 1

## 2014-12-05 MED ORDER — ONDANSETRON HCL 4 MG/2ML IJ SOLN
4.0000 mg | Freq: Once | INTRAMUSCULAR | Status: AC
  Administered 2014-12-05: 4 mg via INTRAVENOUS

## 2014-12-05 MED ORDER — ACETAMINOPHEN 325 MG PO TABS: 650.0000 mg | ORAL_TABLET | Freq: Four times a day (QID) | ORAL | Status: DC | PRN

## 2014-12-05 MED ORDER — SODIUM CHLORIDE 0.9 % IV BOLUS (SEPSIS)
1000.0000 mL | Freq: Once | INTRAVENOUS | Status: AC
  Administered 2014-12-05: 1000 mL via INTRAVENOUS

## 2014-12-05 MED ORDER — IBUPROFEN 400 MG PO TABS: 200.0000 mg | ORAL_TABLET | Freq: Four times a day (QID) | ORAL | Status: DC | PRN

## 2014-12-05 MED ORDER — LEVOTHYROXINE SODIUM 25 MCG PO TABS
25.0000 ug | ORAL_TABLET | Freq: Every day | ORAL | Status: DC
  Administered 2014-12-06: 25 ug via ORAL
  Filled 2014-12-05 (×2): qty 1

## 2014-12-05 NOTE — H&P (Signed)
[place] [facility] [place] NAME: Erin Mercado    MR#:  [id number]  DATE OF BIRTH:  07/03/1965  DATE OF ADMISSION:  12/05/2014  PRIMARY CARE PHYSICIAN: Valera Castle, [facility]  REQUESTING/REFERRING PHYSICIAN: Darrick Penna, MD  CHIEF COMPLAINT:   Chief Complaint  Patient presents with  . Abdominal Pain   HISTORY OF PRESENT ILLNESS:  Erin Mercado  is a 49 y.o. female with a known history hypothyroidism, hypertension, hyperlipidemia being admitted for acute pancreatitis.  Patient started having sudden onset aching epigastric abdominal pain radiating to the back. She has been nauseated. No vomiting, fevers or chills. Normal bowel movement today. No dysuria. No chest pain or difficulty breathing. No recent alcohol use. No modifying factors. Currently her pain is moderate.  But when it is worse.  It is usually 8 out of 10.  He has also been having acute on chronic diarrhea and saw her primary care physician in April of this year when her C. difficile was negative.  She was requested to see GI.  Yesterday her pain started around 3 PM when she was at work BUT was able to manage it, but after eating supper it and in this morning it was uncontrollable, so she decided to come here to the emergency department.  While in the ED, her lipase was 261 and she is being admitted for further evaluation and management.  Her abdominal ultrasound showed fatty infiltration of the liver but no gallstones.  PAST MEDICAL HISTORY:   Past Medical History  Diagnosis Date  . Hypertension   . Heart murmur   . Carotid artery stenosis, unilateral   . Thyroid disease   . Anxiety   . Sleep apnea   . Hyperthyroidism   . Cancer    PAST SURGICAL HISTORY:   Past Surgical History  Procedure Laterality Date  . Abdominal hysterectomy     SOCIAL HISTORY:   History  Substance Use Topics  . Smoking status: Current Every Day Smoker -- 1.00  packs/day for 27 years    Types: Cigarettes  . Smokeless tobacco: Not on file  . Alcohol Use: No   FAMILY HISTORY:   Family History  Problem Relation Age of Onset  . Hypertension Father   . Diabetes Father    DRUG ALLERGIES:  No Known Allergies REVIEW OF SYSTEMS:  Review of Systems  Constitutional: Negative for fever, weight loss, malaise/fatigue and diaphoresis.  HENT: Negative for ear discharge, ear pain, hearing loss, nosebleeds, sore throat and tinnitus.   Eyes: Negative for blurred vision and pain.  Respiratory: Negative for cough, hemoptysis, shortness of breath and wheezing.   Cardiovascular: Negative for chest pain, palpitations, orthopnea and leg swelling.  Gastrointestinal: Positive for nausea, abdominal pain and diarrhea. Negative for heartburn, vomiting, constipation and blood in stool.  Genitourinary: Negative for dysuria, urgency and frequency.  Musculoskeletal: Negative for myalgias and back pain.  Skin: Negative for itching and rash.  Neurological: Negative for dizziness, tingling, tremors, focal weakness, seizures, weakness and headaches.  Psychiatric/Behavioral: Negative for depression. The patient is not nervous/anxious.    MEDICATIONS AT HOME:   Prior to Admission medications   Medication Sig Start Date End Date Taking? Authorizing Provider  ALPRAZolam Duanne Moron) 0.5 MG tablet Take 0.25 mg by mouth daily as needed for anxiety.   Yes Historical Provider, MD  ibuprofen (ADVIL,MOTRIN) 200 MG tablet Take 200 mg by mouth every 6 (six) hours as needed for headache.  Yes Historical Provider, MD  levothyroxine (SYNTHROID, LEVOTHROID) 25 MCG tablet Take 1 tablet (25 mcg total) by mouth daily. For thyroid replacement Patient taking differently: Take 25 mcg by mouth daily.  08/01/11  Yes Darrol Jump, MD  lisinopril (PRINIVIL,ZESTRIL) 40 MG tablet Take 40 mg by mouth daily.   Yes Historical Provider, MD  sertraline (ZOLOFT) 50 MG tablet Take 50 mg by mouth daily.   Yes  Historical Provider, MD  Cholecalciferol (VITAMIN D) 400 UNITS capsule Take 2 capsules (800 Units total) by mouth daily. For Vitamin D replacement and mood control. Patient not taking: Reported on 12/05/2014 08/01/11   Darrol Jump, MD  estradiol (CLIMARA - DOSED IN MG/24 HR) 0.075 mg/24hr Place 1 patch (0.075 mg total) onto the skin once a week. For estrogen replacement. SATURDAY Patient taking differently: Place 1 patch onto the skin once a week. Pt uses on Saturday. 08/01/11   Darrol Jump, MD  lisinopril (PRINIVIL,ZESTRIL) 20 MG tablet Take 1 tablet (20 mg total) by mouth daily. For control of high blood pressure Patient not taking: Reported on 12/05/2014 08/01/11   Darrol Jump, MD      VITAL SIGNS:  Blood pressure 159/98, pulse 53, temperature 97.7 F (36.5 C), temperature source Oral, resp. rate 16, height 4\' 10"  (1.473 m), weight 58.968 kg (130 lb), last menstrual period 07/29/2001, SpO2 100 %. PHYSICAL EXAMINATION:  Physical Exam  Constitutional: She is oriented to person, place, and time and well-developed, well-nourished, and in no distress.  HENT:  Head: Normocephalic and atraumatic.  Eyes: Conjunctivae and EOM are normal. Pupils are equal, round, and reactive to light.  Neck: Normal range of motion. Neck supple. No tracheal deviation present. No thyromegaly present.  Cardiovascular: Normal rate, regular rhythm and normal heart sounds.   Pulmonary/Chest: Effort normal and breath sounds normal. No respiratory distress. She has no wheezes. She exhibits no tenderness.  Abdominal: Soft. Bowel sounds are normal. She exhibits no distension. There is tenderness in the epigastric area.    Musculoskeletal: Normal range of motion.  Neurological: She is alert and oriented to person, place, and time. No cranial nerve deficit.  Skin: Skin is warm and dry. No rash noted.  Psychiatric: Mood and affect normal.   LABORATORY PANEL:   CBC Recent Labs Lab 12/05/14 1236  WBC 12.3*  HGB  16.2*  HCT 48.4*  PLT 211   Chemistries  Recent Labs Lab 12/05/14 1236  NA 137  K 4.0  CL 102  CO2 26  GLUCOSE 113*  BUN 10  CREATININE 0.70  CALCIUM 9.3  AST 23  ALT 28  ALKPHOS 70  BILITOT 0.5   RADIOLOGY:  US Abdomen Limited Ruq  12/05/2014   CLINICAL DATA:  Acute right upper quadrant abdominal pain.  EXAM: US ABDOMEN LIMITED - RIGHT UPPER QUADRANT  COMPARISON:  None.  FINDINGS: Gallbladder:  No gallstones or wall thickening visualized. No sonographic Murphy sign noted.  Common bile duct:  Diameter: 2.2 mm which is within normal limits.  Liver:  No focal lesion identified. Increased echogenicity is noted suggesting fatty infiltration.  IMPRESSION: Diffusely increased echogenicity of hepatic parenchyma is noted consistent with fatty infiltration. No other abnormality seen in the right upper quadrant of the abdomen.   Electronically Signed   By: Marijo Conception, M.D.   On: 12/05/2014 16:53   IMPRESSION AND PLAN:   * Acute pancreatitis: With epigastric abdominal pain radiating to back, her lipase is elevated.  We will consult GI, her  nothing by mouth except ice and medications, monitor her lipase, aggressive IV hydration.  * Acute on chronic diarrhea: C. difficile in April 2016 ordered by her primary care physician was negative.  We will recheck.  Check stool cultures.  GI consult  * Uncontrolled hypertension: can be due to anxiety, we will resume her home blood pressure medication and adjust as needed  * Anxiety: Continue home medication, Xanax   All the records are reviewed and case discussed with ED provider. Management plans discussed with the patient, family and they are in agreement.  CODE STATUS: Full code  TOTAL TIME TAKING CARE OF THIS PATIENT: 55 minutes.    [facility], Breland Trouten M.D on 12/05/2014 at 6:08 PM  Between 7am to 6pm - Pager - [phone]  After 6pm go to [email or web address] - password [email or web address] [facility]  Office  [phone]  CC: Primary  care physician; Valera Castle, MDwith [facility] [place], De [place]., MD [facility]

## 2014-12-05 NOTE — ED Notes (Signed)
Pt reports that she developed gastric pain. She has felt nauseated. No vomiting or diarrhea.

## 2014-12-05 NOTE — ED Provider Notes (Signed)
[facility] Emergency Department Provider Note  ____________________________________________  Time seen: Approximately 3:25 PM  I have reviewed the triage vital signs and the nursing notes.   HISTORY  Chief Complaint Abdominal Pain    HPI Erin Mercado is a 49 y.o. female with history of hypothyroidism, hypertension, hyperlipidemia presents for evaluation of one day gradual onset aching epigastric abdominal pain radiating to the back. She has been nauseated. No vomiting, diarrhea, fevers or chills. Normal bowel movement today. No dysuria. No chest pain or difficulty breathing. No recent alcohol use. No modifying factors. Currently her pain is moderate.   Past Medical History  Diagnosis Date  . Hypertension   . Heart murmur   . Carotid artery stenosis, unilateral   . Thyroid disease   . Anxiety   . Sleep apnea   . Hyperthyroidism   . Cancer     Patient Active Problem List   Diagnosis Date Noted  . Major depressive disorder, recurrent episode 07/31/2011    Class: Acute    Past Surgical History  Procedure Laterality Date  . Abdominal hysterectomy      Current Outpatient Rx  Name  Route  Sig  Dispense  Refill  . ALPRAZolam (XANAX) 0.5 MG tablet   Oral   Take 0.25 mg by mouth daily as needed for anxiety.         Marland Kitchen ibuprofen (ADVIL,MOTRIN) 200 MG tablet   Oral   Take 200 mg by mouth every 6 (six) hours as needed for headache.         . levothyroxine (SYNTHROID, LEVOTHROID) 25 MCG tablet   Oral   Take 1 tablet (25 mcg total) by mouth daily. For thyroid replacement Patient taking differently: Take 25 mcg by mouth daily.    30 tablet   0   . lisinopril (PRINIVIL,ZESTRIL) 40 MG tablet   Oral   Take 40 mg by mouth daily.         . metoprolol succinate (TOPROL-XL) 50 MG 24 hr tablet   Oral   Take 50 mg by mouth at bedtime.         . sertraline (ZOLOFT) 50 MG tablet   Oral   Take 50 mg by mouth daily.         .  Cholecalciferol (VITAMIN D) 400 UNITS capsule   Oral   Take 2 capsules (800 Units total) by mouth daily. For Vitamin D replacement and mood control. Patient not taking: Reported on 12/05/2014   60 capsule   0   . estradiol (CLIMARA - DOSED IN MG/24 HR) 0.075 mg/24hr   Transdermal   Place 1 patch (0.075 mg total) onto the skin once a week. For estrogen replacement. SATURDAY Patient taking differently: Place 1 patch onto the skin once a week. Pt uses on Saturday.   4 patch   0   . lisinopril (PRINIVIL,ZESTRIL) 20 MG tablet   Oral   Take 1 tablet (20 mg total) by mouth daily. For control of high blood pressure Patient not taking: Reported on 12/05/2014   30 tablet   0     Allergies Review of patient's allergies indicates no known allergies.  History reviewed. No pertinent family history.  Social History History  Substance Use Topics  . Smoking status: Current Every Day Smoker -- 1.00 packs/day for 27 years    Types: Cigarettes  . Smokeless tobacco: Not on file  . Alcohol Use: No    Review of Systems Constitutional: No fever/chills Eyes: No  visual changes. ENT: No sore throat. Cardiovascular: Denies chest pain. Respiratory: Denies shortness of breath. Gastrointestinal: + abdominal pain.  + nausea, no vomiting.  No diarrhea.  No constipation. Genitourinary: Negative for dysuria. Musculoskeletal: Negative for back pain. Skin: Negative for rash. Neurological: Negative for headaches, focal weakness or numbness.  10-point ROS otherwise negative.  ____________________________________________   PHYSICAL EXAM:  VITAL SIGNS: ED Triage Vitals  Enc Vitals Group     BP 12/05/14 1231 193/89 mmHg     Pulse Rate 12/05/14 1231 65     Resp 12/05/14 1231 20     Temp 12/05/14 1231 97.7 F (36.5 C)     Temp Source 12/05/14 1231 Oral     SpO2 12/05/14 1231 98 %     Weight 12/05/14 1231 130 lb (58.968 kg)     Height 12/05/14 1231 4\' 10"  (1.473 m)     Head Cir --      Peak  Flow --      Pain Score 12/05/14 1232 8     Pain Loc --      Pain Edu? --      Excl. in [place]? --     Constitutional: Alert and oriented. Well appearing and in no acute distress. Eyes: Conjunctivae are normal. PERRL. EOMI. Head: Atraumatic. Nose: No congestion/rhinnorhea. Mouth/Throat: Mucous membranes are moist.  Oropharynx non-erythematous. Neck: No stridor. Cardiovascular: Normal rate, regular rhythm. Grossly normal heart sounds.  Good peripheral circulation. Respiratory: Normal respiratory effort.  No retractions. Lungs CTAB. Gastrointestinal: Soft with moderate tenderness to palpation in the right upper quadrant and the epigastrium. No distention. No abdominal bruits. No CVA tenderness. Genitourinary: deferred Musculoskeletal: No lower extremity tenderness nor edema.  No joint effusions. Neurologic:  Normal speech and language. No gross focal neurologic deficits are appreciated. Speech is normal. No gait instability. Skin:  Skin is warm, dry and intact. No rash noted. Psychiatric: Mood and affect are normal. Speech and behavior are normal.  ____________________________________________   LABS (all labs ordered are listed, but only abnormal results are displayed)  Labs Reviewed  LIPASE, BLOOD - Abnormal; Notable for the following:    Lipase 261 (*)    All other components within normal limits  CBC WITH DIFFERENTIAL/PLATELET - Abnormal; Notable for the following:    WBC 12.3 (*)    RBC 5.23 (*)    Hemoglobin 16.2 (*)    HCT 48.4 (*)    Neutro Abs 6.8 (*)    Lymphs Abs 4.2 (*)    All other components within normal limits  COMPREHENSIVE METABOLIC PANEL - Abnormal; Notable for the following:    Glucose, Bld 113 (*)    All other components within normal limits  URINALYSIS COMPLETEWITH MICROSCOPIC ([facility] ONLY) - Abnormal; Notable for the following:    Color, Urine YELLOW (*)    APPearance CLEAR (*)    Squamous Epithelial / LPF 0-5 (*)    All other components within normal  limits  TROPONIN I   ____________________________________________  EKG  ED ECG REPORT I, Joanne Gavel, the attending physician, personally viewed and interpreted this ECG.   Date: 12/05/2014  EKG Time: 12:38  Rate: 66  Rhythm: normal sinus rhythm with sinus arrhythmia  Axis: Normal  Intervals:none  ST&T Change: No acute ST segment elevation nonspecific ST/T-wave abnormality.  ____________________________________________  RADIOLOGY  US abdomen  IMPRESSION: Diffusely increased echogenicity of hepatic parenchyma is noted consistent with fatty infiltration. No other abnormality seen in the right upper quadrant of the abdomen.  ____________________________________________   PROCEDURES  Procedure(s) performed: None  Critical Care performed: No  ____________________________________________   INITIAL IMPRESSION / ASSESSMENT AND PLAN / ED COURSE  Pertinent labs & imaging results that were available during my care of the patient were reviewed by me and considered in my medical decision making (see chart for details).  Erin Mercado is a 49 y.o. female with history of hypothyroidism, hypertension, hyperlipidemia presents for evaluation of one day gradual onset aching epigastric abdominal pain. On exam, she is generally well-appearing and in no acute distress. She is hypertensive which I suspect is secondary to pain, we'll continue to monitor. Vital signs are otherwise stable. She is afebrile. She does have moderate to severe tenderness to palpation throughout the epigastrium and the right upper quadrant. Lipase is elevated at 261 and she has mild leukocytosis. She denies any alcohol use. Right upper quadrant ultrasound pending to evaluate gallbladder/evaluate for obstructing stone. We'll give IV fluids, antiemetics and provide pain control.  ----------------------------------------- 5:50 PM on 12/05/2014 -----------------------------------------  Ultrasound  negative for any acute gallbladder pathology. Discussed with hospitalist for admission. ____________________________________________   FINAL CLINICAL IMPRESSION(S) / ED DIAGNOSES  Final diagnoses:  Pain  Epigastric pain  Acute pancreatitis, unspecified pancreatitis type      Joanne Gavel, MD 12/05/14 1750

## 2014-12-06 ENCOUNTER — Telehealth: Payer: Self-pay | Admitting: Urgent Care

## 2014-12-06 ENCOUNTER — Encounter: Payer: Self-pay | Admitting: Urgent Care

## 2014-12-06 ENCOUNTER — Inpatient Hospital Stay: Payer: 59

## 2014-12-06 DIAGNOSIS — K529 Noninfective gastroenteritis and colitis, unspecified: Secondary | ICD-10-CM

## 2014-12-06 DIAGNOSIS — K850 Idiopathic acute pancreatitis: Secondary | ICD-10-CM

## 2014-12-06 LAB — LIPASE, BLOOD: Lipase: 175 U/L — ABNORMAL HIGH (ref 22–51)

## 2014-12-06 LAB — CBC
HCT: 40.7 % (ref 35.0–47.0)
Hemoglobin: 13.9 g/dL (ref 12.0–16.0)
MCH: 31.8 pg (ref 26.0–34.0)
MCHC: 34.2 g/dL (ref 32.0–36.0)
MCV: 93 fL (ref 80.0–100.0)
Platelets: 159 10*3/uL (ref 150–440)
RBC: 4.38 MIL/uL (ref 3.80–5.20)
RDW: 13.7 % (ref 11.5–14.5)
WBC: 8.6 10*3/uL (ref 3.6–11.0)

## 2014-12-06 LAB — BASIC METABOLIC PANEL
Anion gap: 4 — ABNORMAL LOW (ref 5–15)
BUN: 8 mg/dL (ref 6–20)
CHLORIDE: 109 mmol/L (ref 101–111)
CO2: 26 mmol/L (ref 22–32)
Calcium: 8.1 mg/dL — ABNORMAL LOW (ref 8.9–10.3)
Creatinine, Ser: 0.63 mg/dL (ref 0.44–1.00)
GFR calc Af Amer: >60 mL/min (ref >60)
GFR calc non Af Amer: >60 mL/min (ref >60)
GLUCOSE: 89 mg/dL (ref 65–99)
POTASSIUM: 4 mmol/L (ref 3.5–5.1)
SODIUM: 139 mmol/L (ref 135–145)

## 2014-12-06 LAB — TRIGLYCERIDES: Triglycerides: 225 mg/dL — ABNORMAL HIGH (ref <150)

## 2014-12-06 MED ORDER — IOHEXOL 300 MG/ML  SOLN
100.0000 mL | Freq: Once | INTRAMUSCULAR | Status: AC | PRN
  Administered 2014-12-06: 100 mL via INTRAVENOUS

## 2014-12-06 MED ORDER — HYDROCODONE-ACETAMINOPHEN 5-325 MG PO TABS: 1.0000 | ORAL_TABLET | Freq: Four times a day (QID) | ORAL | Status: DC | PRN

## 2014-12-06 MED ORDER — PANTOPRAZOLE SODIUM 40 MG PO TBEC: 40.0000 mg | DELAYED_RELEASE_TABLET | Freq: Every day | ORAL | Status: DC

## 2014-12-06 MED ORDER — IOHEXOL 240 MG/ML SOLN
25.0000 mL | INTRAMUSCULAR | Status: AC
  Administered 2014-12-06: 25 mL via ORAL

## 2014-12-06 MED ORDER — METOPROLOL TARTRATE 50 MG PO TABS: 50.0000 mg | ORAL_TABLET | Freq: Every day | ORAL | Status: DC

## 2014-12-06 MED ORDER — PANTOPRAZOLE SODIUM 40 MG IV SOLR: 40.0000 mg | Freq: Two times a day (BID) | INTRAVENOUS | Status: DC

## 2014-12-06 NOTE — Progress Notes (Signed)
Initial Nutrition Assessment     INTERVENTION:   (Meals and snacks: ) Cater to pt prefences  NUTRITION DIAGNOSIS:  Inadequate oral intake related to altered GI function as evidenced by  (clear liquid diet).    GOAL:  Patient will meet greater than or equal to 90% of their needs   MONITOR:   (Energy intake, Gastrointestinal profile)  REASON FOR ASSESSMENT:   (diagnosis)    ASSESSMENT:  Pt admitted with diarrhea, pancreatitis  PMHx:  Past Medical History  Diagnosis Date  . Hypertension   . Heart murmur   . Carotid artery stenosis, unilateral   . Thyroid disease   . Anxiety   . Sleep apnea   . Hyperthyroidism   . Ovarian cancer 1991    [facility]-chemo & oopherectomy  . Hypercholesteremia   . Hyperglycemia     Diet Order: clear liquid, just progressed to soft  Current Nutrition: tolerating liquids  Food/Nutrition-Related History: Pt reports normal intake one day prior to admission   Medications: NS at 188ml/hr  Electrolyte/Renal Profile and Glucose Profile:   Recent Labs Lab 12/05/14 1236 12/06/14 0524  NA 137 139  K 4.0 4.0  CL 102 109  CO2 26 26  BUN 10 8  CREATININE 0.70 0.63  CALCIUM 9.3 8.1*  GLUCOSE 113* 89   Protein Profile:  Recent Labs Lab 12/05/14 1236  ALBUMIN 4.2   Gastrointestinal profile: lipase 175 Lipids profile: triglycerides 225  Last BM:6/28    Weight Change: stable weight Anthropometrics:    Height:  Ht Readings from Last 1 Encounters:  12/05/14 4\' 10"  (1.473 m)    Weight:  Wt Readings from Last 1 Encounters:  12/06/14 131 lb 4.8 oz (59.557 kg)    Ideal Body Weight:     Wt Readings from Last 10 Encounters:  12/06/14 131 lb 4.8 oz (59.557 kg)    BMI:  Body mass index is 27.45 kg/(m^2).   Skin:   no issues  Diet Order:  Diet - low sodium heart healthy DIET SOFT Room service appropriate?: Yes; Fluid consistency:: Thin  EDUCATION NEEDS:  No education needs identified at this time   LOW  Care Level  Jimeka Balan B. Zenia Resides, [place], [place] (pager)

## 2014-12-06 NOTE — Discharge Summary (Signed)
[facility] [place] NAME: Erin Mercado    MR#:  [id number]  DATE OF BIRTH:  [birth year]  DATE OF ADMISSION:  12/05/2014 ADMITTING PHYSICIAN: Max Sane, MD  DATE OF DISCHARGE:12/06/2014 PRIMARY CARE PHYSICIAN: Dr. Iona Beard   ADMISSION DIAGNOSIS:  Epigastric pain [R10.13] Pain [R52] Acute pancreatitis, unspecified pancreatitis type [K85.9]  DISCHARGE DIAGNOSIS:  Active Problems:   Acute pancreatitis   SECONDARY DIAGNOSIS:   Past Medical History  Diagnosis Date  . Hypertension   . Heart murmur   . Carotid artery stenosis, unilateral   . Thyroid disease   . Anxiety   . Sleep apnea   . Hyperthyroidism   . Ovarian cancer 1991    [facility]-chemo & oopherectomy  . Hypercholesteremia   . Hyperglycemia     HOSPITAL COURSE:  49 year old female with history of hypothyroidism, hypertension and hyperlipidemia who presented with midepigastric abdominal pain radiating to the back suggestive of pancreatitis with elevated lipase.  1. Acute pancreatitis: Patient presented with symptoms as stated above and elevated lipase. This was concerning for acute pancreatitis. Patient was placed nothing by mouth and supportive care. GI has been consulted. Her abdominal pain has improved and she tolerated her diet well. It is unclear to me whether Zoloft caused this or not. Her CT scan of the abdomen was normal except for a kidney lesion.There was no evidence of pancreatitis. I will also try Protonix 40 daily for her midepigastric abdominal pain.  2. Diarrhea: She had no loose stools while in the hospital. This seems to be chronic in nature. GI thought she should should stop ZOLOFT. She will discuss this with her PCP.   3. Uncontrolled hypertension: Patient's blood pressure is elevated this morning. This is likely due to anxiety. Patient will need close follow-up with her primary care physician. She says as an outpatient blood pressure is better  controlled.  4. Anxiety: Patient continue Xanax  5. HYPOTHYROID: TSH was slightly elevated. I did not make changes to her medications. Her PCP can follow up on this and adjust synthroid if needed.   6. Left kidney lesion: Patient [facility] up at [facility] for this.It is being evaluated for malignancy according to patient.   DISCHARGE CONDITIONS AND DIET:  Heart healthy Stable condition  CONSULTS OBTAINED:  Treatment Team:  Lucilla Lame, MD  DRUG ALLERGIES:  No Known Allergies  DISCHARGE MEDICATIONS:   Current Discharge Medication List    START taking these medications   Details  pantoprazole (PROTONIX) 40 MG tablet Take 1 tablet (40 mg total) by mouth daily. Qty: 30 tablet, Refills: 0      CONTINUE these medications which have CHANGED   Details  metoprolol (LOPRESSOR) 50 MG tablet Take 1 tablet (50 mg total) by mouth at bedtime. Qty: 30 tablet, Refills: 0      CONTINUE these medications which have NOT CHANGED   Details  ALPRAZolam (XANAX) 0.5 MG tablet Take 0.25 mg by mouth daily as needed for anxiety.    ibuprofen (ADVIL,MOTRIN) 200 MG tablet Take 200 mg by mouth every 6 (six) hours as needed for headache.    levothyroxine (SYNTHROID, LEVOTHROID) 25 MCG tablet Take 1 tablet (25 mcg total) by mouth daily. For thyroid replacement Qty: 30 tablet, Refills: 0    lisinopril (PRINIVIL,ZESTRIL) 40 MG tablet Take 40 mg by mouth daily.    sertraline (ZOLOFT) 50 MG tablet Take 50 mg by mouth daily.    Cholecalciferol (VITAMIN D) 400 UNITS capsule Take 2 capsules (  800 Units total) by mouth daily. For Vitamin D replacement and mood control. Qty: 60 capsule, Refills: 0    estradiol (CLIMARA - DOSED IN MG/24 HR) 0.075 mg/24hr Place 1 patch (0.075 mg total) onto the skin once a week. For estrogen replacement. SATURDAY Qty: 4 patch, Refills: 0              Today   CHIEF COMPLAINT:  Doing well this am.  Patient is without abdominal pain this morning. Patient's diarrhea has  improved. Patient was recently tested for C. difficile. This was negative according to the patient. Patient would like to try a soft diet   VITAL SIGNS:  Blood pressure 186/85, pulse 55, temperature 97.6 F (36.4 C), temperature source Oral, resp. rate 14, height 4\' 10"  (1.473 m), weight 59.557 kg (131 lb 4.8 oz), last menstrual period 07/29/2001, SpO2 100 %.   REVIEW OF SYSTEMS:  Review of Systems  Constitutional: Negative for fever, chills and malaise/fatigue.  HENT: Negative for sore throat.   Eyes: Negative for blurred vision.  Respiratory: Negative for cough, hemoptysis, shortness of breath and wheezing.   Cardiovascular: Negative for chest pain, palpitations and leg swelling.  Gastrointestinal: Negative for nausea, vomiting, abdominal pain, diarrhea and blood in stool.  Genitourinary: Negative for dysuria.  Musculoskeletal: Negative for back pain.  Neurological: Negative for dizziness, tremors and headaches.  Endo/Heme/Allergies: Does not bruise/bleed easily.     PHYSICAL EXAMINATION:  GENERAL:  49 y.o.-year-old patient lying in the bed with no acute distress.  NECK:  Supple, no jugular venous distention. No thyroid enlargement, no tenderness.  LUNGS: Normal breath sounds bilaterally, no wheezing, rales,rhonchi  No use of accessory muscles of respiration.  CARDIOVASCULAR: S1, S2 normal. No murmurs, rubs, or gallops.  ABDOMEN: Soft, non-tender, non-distended. Bowel sounds present. No organomegaly or mass.  EXTREMITIES: No pedal edema, cyanosis, or clubbing.  PSYCHIATRIC: The patient is alert and oriented x 3.  SKIN: No obvious rash, lesion, or ulcer.   DATA REVIEW:   CBC  Recent Labs Lab 12/06/14 0524  WBC 8.6  HGB 13.9  HCT 40.7  PLT 159    Chemistries   Recent Labs Lab 12/05/14 1236 12/06/14 0524  NA 137 139  K 4.0 4.0  CL 102 109  CO2 26 26  GLUCOSE 113* 89  BUN 10 8  CREATININE 0.70 0.63  CALCIUM 9.3 8.1*  AST 23  --   ALT 28  --   ALKPHOS 70   --   BILITOT 0.5  --     Cardiac Enzymes  Recent Labs Lab 12/05/14 1236  TROPONINI <0.03    Microbiology Results  @[email or web address] @  RADIOLOGY:  US Abdomen Limited Ruq  12/05/2014   CLINICAL DATA:  Acute right upper quadrant abdominal pain.  EXAM: US ABDOMEN LIMITED - RIGHT UPPER QUADRANT  COMPARISON:  None.  FINDINGS: Gallbladder:  No gallstones or wall thickening visualized. No sonographic Murphy sign noted.  Common bile duct:  Diameter: 2.2 mm which is within normal limits.  Liver:  No focal lesion identified. Increased echogenicity is noted suggesting fatty infiltration.  IMPRESSION: Diffusely increased echogenicity of hepatic parenchyma is noted consistent with fatty infiltration. No other abnormality seen in the right upper quadrant of the abdomen.   Electronically Signed   By: Marijo Conception, M.D.   On: 12/05/2014 16:53      Management plans discussed with the patient and she is in agreement. Stable for discharge home  Patient should follow up with PCP  CODE STATUS:     Code Status Orders        Start     Ordered   12/05/14 1845  Full code   Continuous     12/05/14 1844      TOTAL TIME TAKING CARE OF THIS PATIENT: 35 minutes.    Darek Eifler M.D on 12/06/2014 at 10:31 AM  Between 7am to 6pm - Pager - [phone] After 6pm go to [email or web address] - password [email or web address] [facility]  [facility]  Office  [phone]  CC: Primary care physician; No primary care provider on file.

## 2014-12-06 NOTE — Consult Note (Signed)
Gastroenterology Consultation  Referring Provider: Dr. Manuella Ghazi  Primary Care Physician: Dr Iona Beard [facility]) Primary Gastroenterologist:  n/a         Reason for Consultation:     Acute on chronic abdominal pain & diarrhea, elevated lipase  Date of Admission:  12/05/2014 Date of Consultation:  12/06/2014        HPI:   Erin Mercado is a 49 y.o. female admitted with acute on chronic abdominal pain & diarrhea with an elevated lipase.  Patient states for the past 3 months she has had 3-4 loose nonbloody stools daily.  She has had upper abdominal pain that was intermittent 4/10 & felt like gas pains.  Pain increased 2 days ago & is worse after eating.  Pain 8/10 yesterday at worst.  Pain radiates to her mid-back.  She has nausea without vomiting.  Last BM was yesterday & formed.  Denies rectal bleeding, melena or mucus in her stools.  She has lost about 5# in past 3 months.  She has never had a colonoscopy.  Denies heartburn, indigestion, dysphagia, odynophagia or anorexia.  Hx chronic constipation until 3 months ago.  She did start zoloft 3-4 motnhs ago.  No foreign tavel or recent antibiotics.  Denies ETOH or illicit drugs.  She reports high cholesterol but is unsure how high.  Abdominal ultrasound shows no cholelithiasis, biliary dilation or cholecystitis.    Past Medical History  Diagnosis Date  . Hypertension   . Heart murmur   . Carotid artery stenosis, unilateral   . Thyroid disease   . Anxiety   . Sleep apnea   . Hyperthyroidism   . Ovarian cancer 1991    [facility]-chemo & oopherectomy  . Hypercholesteremia   . Hyperglycemia     Past Surgical History  Procedure Laterality Date  . Abdominal hysterectomy      complete     Prior to Admission medications   Medication Sig Start Date End Date Taking? Authorizing Provider  ALPRAZolam Duanne Moron) 0.5 MG tablet Take 0.25 mg by mouth daily as needed for anxiety.   Yes Historical Provider, MD  ibuprofen (ADVIL,MOTRIN) 200 MG tablet Take  200 mg by mouth every 6 (six) hours as needed for headache.   Yes Historical Provider, MD  levothyroxine (SYNTHROID, LEVOTHROID) 25 MCG tablet Take 1 tablet (25 mcg total) by mouth daily. For thyroid replacement Patient taking differently: Take 25 mcg by mouth daily.  08/01/11  Yes Darrol Jump, MD  lisinopril (PRINIVIL,ZESTRIL) 40 MG tablet Take 40 mg by mouth daily.   Yes Historical Provider, MD  sertraline (ZOLOFT) 50 MG tablet Take 50 mg by mouth daily.   Yes Historical Provider, MD  Cholecalciferol (VITAMIN D) 400 UNITS capsule Take 2 capsules (800 Units total) by mouth daily. For Vitamin D replacement and mood control. Patient not taking: Reported on 12/05/2014 08/01/11   Darrol Jump, MD  estradiol (CLIMARA - DOSED IN MG/24 HR) 0.075 mg/24hr Place 1 patch (0.075 mg total) onto the skin once a week. For estrogen replacement. SATURDAY Patient taking differently: Place 1 patch onto the skin once a week. Pt uses on Saturday. 08/01/11   Darrol Jump, MD  lisinopril (PRINIVIL,ZESTRIL) 20 MG tablet Take 1 tablet (20 mg total) by mouth daily. For control of high blood pressure Patient not taking: Reported on 12/05/2014 08/01/11   Darrol Jump, MD    Family History  Problem Relation Age of Onset  . Hypertension Father   . Diabetes Father   .  Colon cancer Neg Hx   . Liver disease Neg Hx      History   Social History Narrative   Married, works as Herbalist at [employer]   2 healthy children   History  Substance Use Topics  . Smoking status: Current Every Day Smoker -- 1.00 packs/day for 27 years    Types: Cigarettes  . Smokeless tobacco: Not on file  . Alcohol Use: 0.0 oz/week    0 Standard drinks or equivalent per week     Comment: rare, couple drinks per year    Allergies as of 12/09/2014  . (No Known Allergies)    Review of Systems:    All systems reviewed and negative except where noted in HPI.   Physical Exam:  Vital signs in last 24 hours: Temp:  [97.6 F (36.4  C)-97.8 F (36.6 C)] 97.6 F (36.4 C) (06/29 0802) Pulse Rate:  [51-78] 55 (06/29 0802) Resp:  [14-20] 14 (06/29 0046) BP: (155-200)/(71-106) 186/85 mmHg (06/29 0802) SpO2:  [97 %-100 %] 100 % (06/29 0802) Weight:  [130 lb (58.968 kg)-131 lb 4.8 oz (59.557 kg)] 131 lb 4.8 oz (59.557 kg) (06/29 0500) Last BM Date: 09-Dec-2014 Body mass index is 27.45 kg/(m^2). General:   Alert,  Well-developed, well-nourished, pleasant and cooperative in NAD Head:  Normocephalic and atraumatic. Eyes:  Sclera clear, no icterus.   Conjunctiva pink. Ears:  Normal auditory acuity. Nose:  No deformity, discharge, or lesions. Mouth:  No deformity or lesions,oropharynx pink & moist. Neck:  Supple; no masses or thyromegaly. Lungs:  Respirations even and unlabored.  Clear throughout to auscultation.   No wheezes, crackles, or rhonchi. No acute distress. Heart:  Regular rate and rhythm; no murmurs, clicks, rubs, or gallops. Abdomen:  Normal bowel sounds.  No bruits.  Soft, non-distended without masses, hepatosplenomegaly or hernias noted.  +moderate epigastric TTP.  No guarding or rebound tenderness.  Negative Carnett sign.   Rectal:  Deferred.  Msk:  Symmetrical without gross deformities.  Good, equal movement & strength bilaterally. Pulses:  Normal pulses noted. Extremities:  No clubbing or edema.  No cyanosis. Neurologic:  Alert and oriented x3;  grossly normal neurologically. Skin:  Intact without significant lesions or rashes.  No jaundice. Lymph Nodes:  No significant cervical adenopathy. Psych:  Alert and cooperative. Normal mood and affect.  LAB RESULTS:  Recent Labs  12-09-14 1236 12/06/14 0524  WBC 12.3* 8.6  HGB 16.2* 13.9  HCT 48.4* 40.7  PLT 211 159   BMET  Recent Labs  12/09/2014 1236 12/06/14 0524  NA 137 139  K 4.0 4.0  CL 102 109  CO2 26 26  GLUCOSE 113* 89  BUN 10 8  CREATININE 0.70 0.63  CALCIUM 9.3 8.1*   LFT  Recent Labs  09-Dec-2014 1236 12/06/14 0524  PROT 7.9  --     ALBUMIN 4.2  --   AST 23  --   ALT 28  --   ALKPHOS 70  --   BILITOT 0.5  --   LIPASE 261* 175*   PT/INR No results for input(s): LABPROT, INR in the last 72 hours. Hepatitis Panel No results for input(s): HEPBSAG, HCVAB, HEPAIGM, HEPBIGM in the last 72 hours. C-Diff No results for input(s): CDIFFTOX in the last 72 hours.  STUDIES: US Abdomen Limited Ruq  12/09/14   CLINICAL DATA:  Acute right upper quadrant abdominal pain.  EXAM: US ABDOMEN LIMITED - RIGHT UPPER QUADRANT  COMPARISON:  None.  FINDINGS: Gallbladder:  No gallstones  or wall thickening visualized. No sonographic Murphy sign noted.  Common bile duct:  Diameter: 2.2 mm which is within normal limits.  Liver:  No focal lesion identified. Increased echogenicity is noted suggesting fatty infiltration.  IMPRESSION: Diffusely increased echogenicity of hepatic parenchyma is noted consistent with fatty infiltration. No other abnormality seen in the right upper quadrant of the abdomen.   Electronically Signed   By: Marijo Conception, M.D.   On: 12/05/2014 16:53     Impression / Plan:   Argie Lober is a 48 y.o. y/o female with first episode of acute pancreatitis.  She has had 3 months of abdominal pain & diarrhea prior to this as well.  CT A/P with IV/oral contrast to further evaluate pancreas.  No evidence of ETOH or cholelithiasis.  I suspect her pancreatitis & diarrhea could be due to Zoloft as it correlates with her symptoms, but will check for autoimmune pancreatitis as well as hypertriglyceridemia.   Plan: 1) Supportive measures including pain control, antiemetics, aggressive fluids & clear liquids 2) CT today 3) ANA, IGG-4, triglycerides 4) would recommend discontinuing ZOLOFT & change to another medication 5) will FU stool studies  Thank you for involving me in the care of this patient.  The care of Chasey Dull will be discussed in direct collaboration with Dr Lucilla Lame, Attending  Gastroenterologist.   LOS: 1 day  Vickey Huger, NP  12/06/2014, 10:28 AM [facility]  [address], [place] [address] Phone: [phone] Fax : [phone]

## 2014-12-06 NOTE — Telephone Encounter (Signed)
Attempted to call CT results to patient, no answer. Will send letter.

## 2014-12-06 NOTE — Progress Notes (Signed)
Pt to have CT scan. Will downgrade to clear liquids until results are back and suspend d/c

## 2014-12-06 NOTE — Progress Notes (Signed)
[facility] [place] NAME: Erin Mercado    MR#:  [id number]  DATE OF BIRTH:  [birth year]  SUBJECTIVE:  Patient is without abdominal pain this morning. Patient's diarrhea has improved. Patient was recently tested for C. difficile. This was negative according to the patient. Patient would like to try a soft diet  REVIEW OF SYSTEMS:    Review of Systems  Constitutional: Negative for fever, chills and malaise/fatigue.  HENT: Negative for sore throat.   Eyes: Negative for blurred vision.  Respiratory: Negative for cough, hemoptysis, shortness of breath and wheezing.   Cardiovascular: Negative for chest pain, palpitations and leg swelling.  Gastrointestinal: Negative for nausea, vomiting, abdominal pain, diarrhea and blood in stool.  Genitourinary: Negative for dysuria.  Musculoskeletal: Negative for back pain.  Neurological: Negative for dizziness, tremors and headaches.  Endo/Heme/Allergies: Does not bruise/bleed easily.    Tolerating Diet: Nothing by mouth      DRUG ALLERGIES:  No Known Allergies  VITALS:  Blood pressure 186/85, pulse 55, temperature 97.6 F (36.4 C), temperature source Oral, resp. rate 14, height 4\' 10"  (1.473 m), weight 59.557 kg (131 lb 4.8 oz), last menstrual period 07/29/2001, SpO2 100 %.  PHYSICAL EXAMINATION:   Physical Exam  Constitutional: She is oriented to person, place, and time and well-developed, well-nourished, and in no distress. No distress.  HENT:  Head: Normocephalic.  Eyes: No scleral icterus.  Neck: Normal range of motion. Neck supple. No JVD present. No tracheal deviation present.  Cardiovascular: Normal rate, regular rhythm and normal heart sounds.  Exam reveals no gallop and no friction rub.   No murmur heard. Pulmonary/Chest: Effort normal and breath sounds normal. No respiratory distress. She has no wheezes. She has no rales. She exhibits no tenderness.  Abdominal: Soft. Bowel  sounds are normal. She exhibits no distension and no mass. There is tenderness. There is no rebound and no guarding.  Musculoskeletal: Normal range of motion. She exhibits no edema.  Neurological: She is alert and oriented to person, place, and time.  Skin: Skin is warm. No rash noted. No erythema.  Psychiatric: Affect and judgment normal.      LABORATORY PANEL:   CBC  Recent Labs Lab 12/06/14 0524  WBC 8.6  HGB 13.9  HCT 40.7  PLT 159   ------------------------------------------------------------------------------------------------------------------  Chemistries   Recent Labs Lab 12/05/14 1236 12/06/14 0524  NA 137 139  K 4.0 4.0  CL 102 109  CO2 26 26  GLUCOSE 113* 89  BUN 10 8  CREATININE 0.70 0.63  CALCIUM 9.3 8.1*  AST 23  --   ALT 28  --   ALKPHOS 70  --   BILITOT 0.5  --    ------------------------------------------------------------------------------------------------------------------  Cardiac Enzymes  Recent Labs Lab 12/05/14 1236  TROPONINI <0.03   ------------------------------------------------------------------------------------------------------------------  RADIOLOGY:  US Abdomen Limited Ruq  12/05/2014   CLINICAL DATA:  Acute right upper quadrant abdominal pain.  EXAM: US ABDOMEN LIMITED - RIGHT UPPER QUADRANT  COMPARISON:  None.  FINDINGS: Gallbladder:  No gallstones or wall thickening visualized. No sonographic Murphy sign noted.  Common bile duct:  Diameter: 2.2 mm which is within normal limits.  Liver:  No focal lesion identified. Increased echogenicity is noted suggesting fatty infiltration.  IMPRESSION: Diffusely increased echogenicity of hepatic parenchyma is noted consistent with fatty infiltration. No other abnormality seen in the right upper quadrant of the abdomen.   Electronically Signed   By: Marijo Conception, M.D.  On: 12/05/2014 16:53     ASSESSMENT AND PLAN:   49 year old female with history of hypothyroidism, hypertension  and hyperlipidemia who presented with midepigastric abdominal pain radiating to the back suggestive of pancreatitis with elevated lipase.  1. Acute pancreatitis: Patient presented with symptoms as stated above and elevated lipase. This was concerning for acute pink otitis. Patient was placed nothing by mouth and supportive care. GI has been consulted. Her abdominal pain is improved. I will try soft diet. I will also try Protonix 40 daily for her midepigastric abdominal pain. 2. Diarrhea: This seems to be chronic in nature. GI has been consultative for further evaluation and management.  3. Uncontrolled hypertension: Patient's blood pressure is elevated this morning. This is likely due to anxiety. Patient will need close follow-up with her primary care physician. She says as an outpatient blood pressure is better controlled.  4. Anxiety: Patient continue Xanax      Management plans discussed with the patient and she is in agreement.  CODE STATUS: FULL  TOTAL TIME TAKING CARE OF THIS PATIENT: 35 minutes.   Greater than 50% counseling and coordination of care  POSSIBLE D/C today or tomorrow, DEPENDING ON CLINICAL CONDITION.   Ovie Eastep M.D on 12/06/2014 at 10:26 AM  Between 7am to 6pm - Pager - [phone] After 6pm go to [email or web address] - password [email or web address] [facility]  [facility] [place] [facility]  Office  [phone]  CC: Primary care physician; No primary care provider on file.

## 2014-12-07 LAB — ANA W/REFLEX IF POSITIVE: Anti Nuclear Antibody(ANA): NEGATIVE

## 2014-12-07 LAB — IGG 4: IgG, Subclass 4: 80 mg/dL (ref 1–291)

## 2014-12-08 ENCOUNTER — Encounter: Payer: Self-pay | Admitting: Urgent Care

## 2014-12-21 ENCOUNTER — Other Ambulatory Visit: Payer: Self-pay | Admitting: Physician Assistant

## 2014-12-21 DIAGNOSIS — R1011 Right upper quadrant pain: Secondary | ICD-10-CM

## 2014-12-27 ENCOUNTER — Encounter
Admission: RE | Admit: 2014-12-27 | Discharge: 2014-12-27 | Disposition: A | Discharge status: Home / Self Care | Payer: 59 | Source: Ambulatory Visit | Attending: Physician Assistant | Admitting: Physician Assistant

## 2014-12-27 DIAGNOSIS — R1011 Right upper quadrant pain: Secondary | ICD-10-CM | POA: Diagnosis present

## 2014-12-27 MED ORDER — TECHNETIUM TC 99M MEBROFENIN IV KIT
5.0000 | PACK | Freq: Once | INTRAVENOUS | Status: AC | PRN
  Administered 2014-12-27: 4.94 via INTRAVENOUS

## 2014-12-27 MED ORDER — SINCALIDE 5 MCG IJ SOLR
0.0200 ug/kg | Freq: Once | INTRAMUSCULAR | Status: AC
  Administered 2014-12-27: 1.17 ug via INTRAVENOUS

## 2015-03-06 DIAGNOSIS — K2950 Unspecified chronic gastritis without bleeding: Secondary | ICD-10-CM | POA: Insufficient documentation

## 2015-03-08 DIAGNOSIS — K760 Fatty (change of) liver, not elsewhere classified: Secondary | ICD-10-CM | POA: Insufficient documentation

## 2015-06-24 ENCOUNTER — Emergency Department: Payer: 59

## 2015-06-24 ENCOUNTER — Observation Stay
Admission: EM | Admit: 2015-06-24 | Discharge: 2015-06-25 | Disposition: A | Discharge status: Home / Self Care | Payer: 59 | Attending: Surgery | Admitting: Surgery

## 2015-06-24 ENCOUNTER — Encounter: Payer: Self-pay | Admitting: Emergency Medicine

## 2015-06-24 DIAGNOSIS — K37 Unspecified appendicitis: Secondary | ICD-10-CM | POA: Diagnosis present

## 2015-06-24 DIAGNOSIS — E7800 Pure hypercholesterolemia, unspecified: Secondary | ICD-10-CM | POA: Insufficient documentation

## 2015-06-24 DIAGNOSIS — I10 Essential (primary) hypertension: Secondary | ICD-10-CM | POA: Insufficient documentation

## 2015-06-24 DIAGNOSIS — F17210 Nicotine dependence, cigarettes, uncomplicated: Secondary | ICD-10-CM | POA: Diagnosis not present

## 2015-06-24 DIAGNOSIS — Z8719 Personal history of other diseases of the digestive system: Secondary | ICD-10-CM | POA: Insufficient documentation

## 2015-06-24 DIAGNOSIS — Z8543 Personal history of malignant neoplasm of ovary: Secondary | ICD-10-CM | POA: Diagnosis not present

## 2015-06-24 DIAGNOSIS — E0590 Thyrotoxicosis, unspecified without thyrotoxic crisis or storm: Secondary | ICD-10-CM | POA: Insufficient documentation

## 2015-06-24 DIAGNOSIS — Z90710 Acquired absence of both cervix and uterus: Secondary | ICD-10-CM | POA: Insufficient documentation

## 2015-06-24 DIAGNOSIS — K3580 Unspecified acute appendicitis: Secondary | ICD-10-CM | POA: Diagnosis not present

## 2015-06-24 DIAGNOSIS — D72829 Elevated white blood cell count, unspecified: Secondary | ICD-10-CM | POA: Insufficient documentation

## 2015-06-24 DIAGNOSIS — G4730 Sleep apnea, unspecified: Secondary | ICD-10-CM | POA: Insufficient documentation

## 2015-06-24 DIAGNOSIS — K353 Acute appendicitis with localized peritonitis, without perforation or gangrene: Secondary | ICD-10-CM

## 2015-06-24 DIAGNOSIS — R1011 Right upper quadrant pain: Secondary | ICD-10-CM | POA: Diagnosis not present

## 2015-06-24 HISTORY — DX: Acute pancreatitis without necrosis or infection, unspecified: K85.90

## 2015-06-24 LAB — CBC
HCT: 48.9 % — ABNORMAL HIGH (ref 35.0–47.0)
HEMOGLOBIN: 16.3 g/dL — AB (ref 12.0–16.0)
MCH: 31.4 pg (ref 26.0–34.0)
MCHC: 33.2 g/dL (ref 32.0–36.0)
MCV: 94.4 fL (ref 80.0–100.0)
Platelets: 193 10*3/uL (ref 150–440)
RBC: 5.19 MIL/uL (ref 3.80–5.20)
RDW: 13.3 % (ref 11.5–14.5)
WBC: 13.1 10*3/uL — ABNORMAL HIGH (ref 3.6–11.0)

## 2015-06-24 LAB — COMPREHENSIVE METABOLIC PANEL
ALBUMIN: 4.5 g/dL (ref 3.5–5.0)
ALK PHOS: 59 U/L (ref 38–126)
ALT: 35 U/L (ref 14–54)
ANION GAP: 10 (ref 5–15)
AST: 28 U/L (ref 15–41)
BUN: 10 mg/dL (ref 6–20)
CHLORIDE: 105 mmol/L (ref 101–111)
CO2: 22 mmol/L (ref 22–32)
Calcium: 9.1 mg/dL (ref 8.9–10.3)
Creatinine, Ser: 0.62 mg/dL (ref 0.44–1.00)
GFR calc non Af Amer: >60 mL/min (ref >60)
GLUCOSE: 116 mg/dL — AB (ref 65–99)
Potassium: 3.5 mmol/L (ref 3.5–5.1)
SODIUM: 137 mmol/L (ref 135–145)
Total Bilirubin: 0.4 mg/dL (ref 0.3–1.2)
Total Protein: 8.2 g/dL — ABNORMAL HIGH (ref 6.5–8.1)

## 2015-06-24 LAB — URINALYSIS COMPLETE WITH MICROSCOPIC (ARMC ONLY)
Bilirubin Urine: NEGATIVE
Glucose, UA: NEGATIVE mg/dL
Hgb urine dipstick: NEGATIVE
Ketones, ur: NEGATIVE mg/dL
Leukocytes, UA: NEGATIVE
Nitrite: NEGATIVE
PH: 5 (ref 5.0–8.0)
PROTEIN: NEGATIVE mg/dL
Specific Gravity, Urine: 1.019 (ref 1.005–1.030)

## 2015-06-24 LAB — TROPONIN I

## 2015-06-24 LAB — LIPASE, BLOOD: LIPASE: 39 U/L (ref 11–51)

## 2015-06-24 MED ORDER — MORPHINE SULFATE (PF) 4 MG/ML IV SOLN
INTRAVENOUS | Status: AC
  Administered 2015-06-24: 4 mg via INTRAVENOUS
  Filled 2015-06-24: qty 1

## 2015-06-24 MED ORDER — ONDANSETRON HCL 4 MG/2ML IJ SOLN: 4.0000 mg | Freq: Four times a day (QID) | INTRAMUSCULAR | Status: DC | PRN

## 2015-06-24 MED ORDER — ACETAMINOPHEN 325 MG PO TABS: 650.0000 mg | ORAL_TABLET | Freq: Four times a day (QID) | ORAL | Status: DC | PRN

## 2015-06-24 MED ORDER — MORPHINE SULFATE (PF) 4 MG/ML IV SOLN: 4.0000 mg | INTRAVENOUS | Status: DC | PRN

## 2015-06-24 MED ORDER — IOHEXOL 300 MG/ML  SOLN
80.0000 mL | Freq: Once | INTRAMUSCULAR | Status: AC | PRN
  Administered 2015-06-24: 80 mL via INTRAVENOUS
  Filled 2015-06-24: qty 80

## 2015-06-24 MED ORDER — ONDANSETRON HCL 4 MG/2ML IJ SOLN
4.0000 mg | Freq: Once | INTRAMUSCULAR | Status: AC
  Administered 2015-06-24: 4 mg via INTRAVENOUS

## 2015-06-24 MED ORDER — SODIUM CHLORIDE 0.9 % IV BOLUS (SEPSIS)
1000.0000 mL | Freq: Once | INTRAVENOUS | Status: AC
  Administered 2015-06-24: 1000 mL via INTRAVENOUS

## 2015-06-24 MED ORDER — PIPERACILLIN-TAZOBACTAM 3.375 G IVPB
3.3750 g | Freq: Three times a day (TID) | INTRAVENOUS | Status: DC
  Administered 2015-06-25 (×2): 3.375 g via INTRAVENOUS
  Filled 2015-06-24 (×6): qty 50

## 2015-06-24 MED ORDER — ACETAMINOPHEN 650 MG RE SUPP: 650.0000 mg | Freq: Four times a day (QID) | RECTAL | Status: DC | PRN

## 2015-06-24 MED ORDER — ENOXAPARIN SODIUM 40 MG/0.4ML ~~LOC~~ SOLN
40.0000 mg | SUBCUTANEOUS | Status: DC
Start: 2015-06-24 — End: 2015-06-26

## 2015-06-24 MED ORDER — MORPHINE SULFATE (PF) 4 MG/ML IV SOLN
4.0000 mg | Freq: Once | INTRAVENOUS | Status: AC
  Administered 2015-06-24: 4 mg via INTRAVENOUS

## 2015-06-24 MED ORDER — ONDANSETRON 4 MG PO TBDP: 4.0000 mg | ORAL_TABLET | Freq: Four times a day (QID) | ORAL | Status: DC | PRN

## 2015-06-24 MED ORDER — KCL IN DEXTROSE-NACL 20-5-0.45 MEQ/L-%-% IV SOLN
INTRAVENOUS | Status: DC
  Administered 2015-06-25 (×2): via INTRAVENOUS
  Filled 2015-06-24 (×4): qty 1000

## 2015-06-24 MED ORDER — ONDANSETRON HCL 4 MG/2ML IJ SOLN
INTRAMUSCULAR | Status: AC
  Administered 2015-06-24: 4 mg via INTRAVENOUS
  Filled 2015-06-24: qty 2

## 2015-06-24 MED ORDER — MORPHINE SULFATE (PF) 2 MG/ML IV SOLN
2.0000 mg | Freq: Once | INTRAVENOUS | Status: AC
  Administered 2015-06-24: 2 mg via INTRAVENOUS
  Filled 2015-06-24: qty 1

## 2015-06-24 MED ORDER — MORPHINE SULFATE (PF) 4 MG/ML IV SOLN
4.0000 mg | Freq: Once | INTRAVENOUS | Status: AC
  Administered 2015-06-24: 4 mg via INTRAVENOUS
  Filled 2015-06-24: qty 1

## 2015-06-24 MED ORDER — ONDANSETRON HCL 4 MG/2ML IJ SOLN
4.0000 mg | Freq: Once | INTRAMUSCULAR | Status: AC | PRN
  Administered 2015-06-24: 4 mg via INTRAVENOUS
  Filled 2015-06-24: qty 2

## 2015-06-24 MED ORDER — IOHEXOL 240 MG/ML SOLN
25.0000 mL | Freq: Once | INTRAMUSCULAR | Status: AC | PRN
Start: 2015-06-24 — End: 2015-06-24
  Administered 2015-06-24: 25 mL via ORAL
  Filled 2015-06-24: qty 25

## 2015-06-24 NOTE — H&P (Signed)
Erin Mercado is a 50 y.o. female  abdominal pain for approximately 8 hours.  HPI: She was in her usual state of good health until this afternoon after lunch when she developed some midepigastric subxiphoid abdominal pain. She did not have any nausea or vomiting. The pain progressed over the course of the afternoon she percent [facility] for further evaluation. She is slightly elevated white blood cell count. She has marked right lower quadrant tenderness. CT scan was performed which demonstrates changes consistent with acute appendicitis. Surgical service was consulted.  She denies any previous similar symptoms although she does have abdominal pain on regular basis. Pain is primarily subxiphoid and right upper quadrant. She was admitted a year ago for possible pancreatitis without have any gallstones in her slightly elevated lipase was felt to be related to medication. She's not had an upper endoscopy. She does have history of significant diarrhea occurring 1-2 weeks a month. She has been worked up extensively  Hospital does not have a colonoscopy. She denies any other significant abdominal problems. Her only previous abdominal surgery was a abdominal hysterectomy specifically she denies any history of hepatitis, yellow jaundice, peptic ulcer disease, gallbladder disease, or diverticulitis.  Past Medical History  Diagnosis Date  . Hypertension   . Heart murmur   . Carotid artery stenosis, unilateral   . Thyroid disease   . Anxiety   . Sleep apnea   . Hyperthyroidism   . Ovarian cancer ([place]) 1991    [facility]-chemo & oopherectomy  . Hypercholesteremia   . Hyperglycemia   . Pancreatitis    Past Surgical History  Procedure Laterality Date  . Abdominal hysterectomy      complete   . Abscess drainage     Social History   Social History  . Marital Status: Married    Spouse Name: N/A  . Number of Children: 2  . Years of Education: N/A   Occupational History  .  [employer]   Social  History Main Topics  . Smoking status: Current Every Day Smoker -- 1.00 packs/day for 27 years    Types: Cigarettes  . Smokeless tobacco: None  . Alcohol Use: 0.0 oz/week    0 Standard drinks or equivalent per week     Comment: rare, couple drinks per year  . Drug Use: No  . Sexual Activity: Yes    Birth Control/ Protection: None   Other Topics Concern  . 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  HENT: Negative for congestion.   Eyes: Negative.   Respiratory: Negative for cough and shortness of breath.   Cardiovascular: Negative for chest pain, palpitations and orthopnea.  Gastrointestinal: Positive for heartburn, abdominal pain and diarrhea. Negative for nausea and vomiting.  Genitourinary: Negative.   Musculoskeletal: Negative.   Skin: Negative for itching and rash.  Neurological: Negative.  Negative for headaches.  Psychiatric/Behavioral: Positive for depression. The patient is nervous/anxious.      PHYSICAL EXAM: BP 171/83 mmHg  Pulse 83  Temp(Src) 97.7 F (36.5 C) (Oral)  Resp 20  Ht 4\' 11"  (1.499 m)  Wt 61.236 kg (135 lb)  BMI 27.25 kg/m2  SpO2 95%  LMP 07/29/2001  Physical Exam  Constitutional: She is oriented to person, place, and time. She appears well-developed and well-nourished. No distress.  HENT:  Head: Normocephalic and atraumatic.  Eyes: EOM  are normal. Pupils are equal, round, and reactive to light.  Neck: Normal range of motion. Neck supple.  Cardiovascular: Normal rate, regular rhythm and normal heart sounds.   Pulmonary/Chest: Effort normal and breath sounds normal.  Abdominal: Soft. Bowel sounds are normal. She exhibits no distension. There is tenderness. There is rebound and guarding.  Musculoskeletal: Normal range of motion. She exhibits no edema or tenderness.  Neurological: She is alert and oriented  to person, place, and time.  Skin: Skin is warm and dry.  Psychiatric: Her behavior is normal. Judgment normal.   Her abdomen is markedly tender right lower quadrant with rebound and guarding. She does have active bowel sounds.  Impression/Plan: I independently reviewed her CT scan. I also reviewed her CT scan from last year during her admission. Her appendix is enlarged. There is air in the tip of the appendix but it is much thicker than it was on the previous exam there are periappendiceal changes consistent with inflammatory disease. There does not appear to be any evidence of perforation.  In this situation although she has a typical story with her elevated white blood cell count clinical examination and CT findings we will recommend surgical intervention. We will proceed with surgery later this evening. This plans were discussed with her in detail and she is in agreement. Risks benefits and options of been outlined and accepted    Dia Crawford III, MD  06/24/2015, 10:16 PM

## 2015-06-24 NOTE — ED Notes (Signed)
Pt signed consent, witnessed by this RN.

## 2015-06-24 NOTE — ED Provider Notes (Signed)
[facility] Emergency Department Provider Note  ____________________________________________  Time seen: Approximately 10:18 PM  I have reviewed the triage vital signs and the nursing notes.   HISTORY  Chief Complaint Abdominal Pain    HPI Erin Mercado is a 50 y.o. female who presents for evaluation of abdominal pain starting around 2 PM. Pain started in the upper to mid abdomen and has now moved over to the right side. Not associated with any fever, but reports significant pain on the right side of the stomach.  Described as achy and sore and persistent pain.   Past Medical History  Diagnosis Date  . Hypertension   . Heart murmur   . Carotid artery stenosis, unilateral   . Thyroid disease   . Anxiety   . Sleep apnea   . Hyperthyroidism   . Ovarian cancer ([place]) 1991    [facility]-chemo & oopherectomy  . Hypercholesteremia   . Hyperglycemia   . Pancreatitis     Patient Active Problem List   Diagnosis Date Noted  . Appendicitis 06/24/2015  . Chronic diarrhea   . Acute pancreatitis 12/05/2014  . Major depressive disorder, recurrent episode 07/31/2011    Class: Acute    Past Surgical History  Procedure Laterality Date  . Abdominal hysterectomy      complete   . Abscess drainage      Current Outpatient Rx  Name  Route  Sig  Dispense  Refill  . ALPRAZolam (XANAX) 0.5 MG tablet   Oral   Take 0.25 mg by mouth daily as needed for anxiety.         . Cholecalciferol (VITAMIN D) 400 UNITS capsule   Oral   Take 2 capsules (800 Units total) by mouth daily. For Vitamin D replacement and mood control. Patient not taking: Reported on 12/05/2014   60 capsule   0   . estradiol (CLIMARA - DOSED IN MG/24 HR) 0.075 mg/24hr   Transdermal   Place 1 patch (0.075 mg total) onto the skin once a week. For estrogen replacement. SATURDAY Patient taking differently: Place 1 patch onto the skin once a week. Pt uses on Saturday.   4 patch   0   .  HYDROcodone-acetaminophen (NORCO/VICODIN) 5-325 MG per tablet   Oral   Take 1 tablet by mouth every 6 (six) hours as needed for moderate pain or severe pain.   10 tablet   0   . ibuprofen (ADVIL,MOTRIN) 200 MG tablet   Oral   Take 200 mg by mouth every 6 (six) hours as needed for headache.         . levothyroxine (SYNTHROID, LEVOTHROID) 25 MCG tablet   Oral   Take 1 tablet (25 mcg total) by mouth daily. For thyroid replacement Patient taking differently: Take 25 mcg by mouth daily.    30 tablet   0   . lisinopril (PRINIVIL,ZESTRIL) 40 MG tablet   Oral   Take 40 mg by mouth daily.         . metoprolol (LOPRESSOR) 50 MG tablet   Oral   Take 1 tablet (50 mg total) by mouth at bedtime.   30 tablet   0   . pantoprazole (PROTONIX) 40 MG tablet   Oral   Take 1 tablet (40 mg total) by mouth daily.   30 tablet   0   . sertraline (ZOLOFT) 50 MG tablet   Oral   Take 50 mg by mouth daily.  Allergies Review of patient's allergies indicates no known allergies.  Family History  Problem Relation Age of Onset  . Hypertension Father   . Diabetes Father   . Colon cancer Neg Hx   . Liver disease Neg Hx     Social History Social History  Substance Use Topics  . Smoking status: Current Every Day Smoker -- 1.00 packs/day for 27 years    Types: Cigarettes  . Smokeless tobacco: None  . Alcohol Use: 0.0 oz/week    0 Standard drinks or equivalent per week     Comment: rare, couple drinks per year    Review of Systems Constitutional: No fever/chills Eyes: No visual changes. ENT: No sore throat. Cardiovascular: Denies chest pain. Respiratory: Denies shortness of breath. Gastrointestinal:   No diarrhea.  No constipation. Genitourinary: Negative for dysuria. Musculoskeletal: Negative for back pain. Skin: Negative for rash. Neurological: Negative for headaches, focal weakness or numbness.  10-point ROS otherwise  negative.  ____________________________________________   PHYSICAL EXAM:  VITAL SIGNS: ED Triage Vitals  Enc Vitals Group     BP 06/24/15 1802 171/83 mmHg     Pulse Rate 06/24/15 1802 83     Resp 06/24/15 1802 20     Temp 06/24/15 1802 97.7 F (36.5 C)     Temp Source 06/24/15 1802 Oral     SpO2 06/24/15 1802 95 %     Weight 06/24/15 1802 135 lb (61.236 kg)     Height 06/24/15 1802 4\' 11"  (1.499 m)     Head Cir --      Peak Flow --      Pain Score 06/24/15 1803 10     Pain Loc --      Pain Edu? --      Excl. in [place]? --    Constitutional: Alert and oriented. Well appearing and in no acute distress. Eyes: Conjunctivae are normal. PERRL. EOMI. Head: Atraumatic. Nose: No congestion/rhinnorhea. Mouth/Throat: Mucous membranes are moist.  Oropharynx non-erythematous. Neck: No stridor.   Cardiovascular: Normal rate, regular rhythm. Grossly normal heart sounds.  Good peripheral circulation. Respiratory: Normal respiratory effort.  No retractions. Lungs CTAB. Gastrointestinal: Soft and nontender except for moderate to severe tenderness with some voluntary guarding involving the right to right lower quadrant. Negative Murphy.. No distention. No abdominal bruits. No CVA tenderness. Musculoskeletal: No lower extremity tenderness nor edema.  No joint effusions. Neurologic:  Normal speech and language. No gross focal neurologic deficits are appreciated. No gait instability. Skin:  Skin is warm, dry and intact. No rash noted. Psychiatric: Mood and affect are normal. Speech and behavior are normal.  ____________________________________________   LABS (all labs ordered are listed, but only abnormal results are displayed)  Labs Reviewed  COMPREHENSIVE METABOLIC PANEL - Abnormal; Notable for the following:    Glucose, Bld 116 (*)    Total Protein 8.2 (*)    All other components within normal limits  CBC - Abnormal; Notable for the following:    WBC 13.1 (*)    Hemoglobin 16.3 (*)     HCT 48.9 (*)    All other components within normal limits  URINALYSIS COMPLETEWITH MICROSCOPIC ([facility] ONLY) - Abnormal; Notable for the following:    Color, Urine YELLOW (*)    APPearance CLEAR (*)    Bacteria, UA RARE (*)    Squamous Epithelial / LPF 0-5 (*)    All other components within normal limits  LIPASE, BLOOD  TROPONIN I  CBC  CREATININE, SERUM  BASIC METABOLIC PANEL  CBC   ____________________________________________  EKG  Reviewed and interpreted by me at 2035 Ventricular rate 70 PR 140 QRS 85 QTc 450 Normal sinus rhythm, minimal T-wave inversions noted in a anteroseptal distribution without evidence of acute ST elevation. ____________________________________________  RADIOLOGY  CT Abdomen Pelvis W Contrast (Final result) Result time: 06/24/15 21:27:04   Final result by Rad Results In Interface (06/24/15 21:27:04)   Narrative:   CLINICAL DATA: 50 year old female with abdominal pain extending from the center of the abdomen into the right upper quadrant.  EXAM: CT ABDOMEN AND PELVIS WITH CONTRAST  TECHNIQUE: Multidetector CT imaging of the abdomen and pelvis was performed using the standard protocol following bolus administration of intravenous contrast.  CONTRAST: 45mL OMNIPAQUE IOHEXOL 300 MG/ML SOLN  COMPARISON: Right upper quadrant ultrasound dated 06/24/2015 abdominal CT dated 12/06/2014  FINDINGS: The visualized lung bases are clear. No intra-abdominal free air or free fluid.  Diffuse hepatic steatosis. The gallbladder, pancreas, spleen, and the adrenal glands appear unremarkable. A 1.4 x 1.9 cm grossly stable appearing lesion is again noted in the medial upper pole of the left kidney. This lesion was seen on the prior CT as well as of the ultrasound and is not well characterized. MRI without and with contrast, as previously recommended, is advised for further characterization. There is no hydronephrosis on either side. The visualized  ureters appear unremarkable. The urinary bladder is collapsed. The uterus is not visualized, likely surgically absent.  Oral contrast opacifies the stomach and loops of small bowel without evidence of obstruction. There is moderate stool throughout the colon. There is inflammatory changes of the appendix. High attenuating content within the proximal appendix likely represent appendicolith. The appendix measures approximately 8 mm in diameter. It is located in the right lower quadrant lateral to the cecum. No drainable fluid collection/abscess or evidence of perforation.  Mild aortoiliac atherosclerotic disease. The abdominal aorta and IVC are patent. No portal venous gas identified. There is no adenopathy. There is a retro aortic left renal vein anatomy. Midline vertical anterior abdominal wall incisional scar. The osseous structures are intact.  IMPRESSION: Acute appendicitis. No abscess.  Indeterminate hypodense lesion from the superior pole of the left kidney. MRI is recommended for further characterization.   Electronically Signed By: Anner Crete M.D. On: 06/24/2015 21:27   Discussed the noted left renal lesion with patient, and she reports that she is being followed closely by this [facility] with regular follow-ups, thus far not thought to be cancerous. ____________________________________________   PROCEDURES  Procedure(s) performed: None  Critical Care performed: No  ____________________________________________   INITIAL IMPRESSION / ASSESSMENT AND PLAN / ED COURSE  Pertinent labs & imaging results that were available during my care of the patient were reviewed by me and considered in my medical decision making (see chart for details).  Acute right-sided abdominal pain with associated leukocytosis and focal right mid to right lower quadrant tenderness.  CT consistent with acute appendicitis, appears to fit her clinical picture well. Consult to  Dr. Pat Patrick who is admitting patient. ____________________________________________   FINAL CLINICAL IMPRESSION(S) / ED DIAGNOSES  Final diagnoses:  Acute appendicitis with localized peritonitis      Delman Kitten, MD 06/24/15 2222

## 2015-06-24 NOTE — ED Notes (Signed)
Patient presents to the ED with abdominal pain in the center of her abdomen radiating to her right upper quadrant.  Patient reports having similar pain in the past that was caused by pancreatitis.  Patient states pain started after she ate lunch.  Patient states she ate a gravy biscuit for lunch.  Patient's right upper quadrant is tender.  Patient is tearful in triage.

## 2015-06-25 ENCOUNTER — Observation Stay: Payer: 59 | Admitting: Anesthesiology

## 2015-06-25 ENCOUNTER — Encounter
Admission: EM | Disposition: A | Discharge status: Home / Self Care | Payer: Self-pay | Source: Home / Self Care | Attending: Emergency Medicine

## 2015-06-25 ENCOUNTER — Encounter: Payer: Self-pay | Admitting: Certified Registered Nurse Anesthetist

## 2015-06-25 DIAGNOSIS — K353 Acute appendicitis with localized peritonitis: Secondary | ICD-10-CM | POA: Diagnosis not present

## 2015-06-25 HISTORY — PX: LAPAROSCOPIC APPENDECTOMY: SHX408

## 2015-06-25 LAB — CBC
HEMATOCRIT: 42.2 % (ref 35.0–47.0)
HEMOGLOBIN: 13.8 g/dL (ref 12.0–16.0)
MCH: 30.6 pg (ref 26.0–34.0)
MCHC: 32.6 g/dL (ref 32.0–36.0)
MCV: 93.9 fL (ref 80.0–100.0)
Platelets: 171 10*3/uL (ref 150–440)
RBC: 4.5 MIL/uL (ref 3.80–5.20)
RDW: 13.6 % (ref 11.5–14.5)
WBC: 17.2 10*3/uL — ABNORMAL HIGH (ref 3.6–11.0)

## 2015-06-25 LAB — BASIC METABOLIC PANEL
ANION GAP: 6 (ref 5–15)
BUN: 9 mg/dL (ref 6–20)
CO2: 22 mmol/L (ref 22–32)
Calcium: 7.8 mg/dL — ABNORMAL LOW (ref 8.9–10.3)
Chloride: 108 mmol/L (ref 101–111)
Creatinine, Ser: 0.69 mg/dL (ref 0.44–1.00)
GFR calc Af Amer: >60 mL/min (ref >60)
GLUCOSE: 180 mg/dL — AB (ref 65–99)
POTASSIUM: 4.1 mmol/L (ref 3.5–5.1)
Sodium: 136 mmol/L (ref 135–145)

## 2015-06-25 SURGERY — APPENDECTOMY, LAPAROSCOPIC
Anesthesia: General | Wound class: Clean Contaminated

## 2015-06-25 MED ORDER — EPHEDRINE SULFATE 50 MG/ML IJ SOLN
INTRAMUSCULAR | Status: DC | PRN
  Administered 2015-06-25: 10 mg via INTRAVENOUS

## 2015-06-25 MED ORDER — HYDROMORPHONE HCL 1 MG/ML IJ SOLN
INTRAMUSCULAR | Status: AC
  Filled 2015-06-25: qty 1

## 2015-06-25 MED ORDER — FENTANYL CITRATE (PF) 100 MCG/2ML IJ SOLN: 25.0000 ug | INTRAMUSCULAR | Status: DC | PRN

## 2015-06-25 MED ORDER — METOPROLOL TARTRATE 1 MG/ML IV SOLN
5.0000 mg | Freq: Four times a day (QID) | INTRAVENOUS | Status: DC
  Filled 2015-06-25: qty 5

## 2015-06-25 MED ORDER — SUCCINYLCHOLINE CHLORIDE 20 MG/ML IJ SOLN
INTRAMUSCULAR | Status: DC | PRN
  Administered 2015-06-25: 100 mg via INTRAVENOUS

## 2015-06-25 MED ORDER — HYDROMORPHONE HCL 1 MG/ML IJ SOLN
0.2500 mg | INTRAMUSCULAR | Status: DC | PRN
  Administered 2015-06-25 (×6): 0.25 mg via INTRAVENOUS

## 2015-06-25 MED ORDER — MIDAZOLAM HCL 2 MG/2ML IJ SOLN
INTRAMUSCULAR | Status: DC | PRN
Start: 2015-06-25 — End: 2015-06-25
  Administered 2015-06-25: 2 mg via INTRAVENOUS

## 2015-06-25 MED ORDER — PROPOFOL 10 MG/ML IV BOLUS
INTRAVENOUS | Status: DC | PRN
  Administered 2015-06-25: 150 mg via INTRAVENOUS

## 2015-06-25 MED ORDER — HYDROCODONE-ACETAMINOPHEN 5-325 MG PO TABS
1.0000 | ORAL_TABLET | ORAL | Status: DC | PRN
  Administered 2015-06-25: 2 via ORAL
  Filled 2015-06-25: qty 2

## 2015-06-25 MED ORDER — BUPIVACAINE HCL (PF) 0.25 % IJ SOLN
INTRAMUSCULAR | Status: DC | PRN
  Administered 2015-06-25: 30 mL

## 2015-06-25 MED ORDER — METOPROLOL TARTRATE 1 MG/ML IV SOLN
INTRAVENOUS | Status: DC | PRN
  Administered 2015-06-25: 1 mg via INTRAVENOUS
  Administered 2015-06-25: 2 mg via INTRAVENOUS

## 2015-06-25 MED ORDER — HYDROMORPHONE HCL 1 MG/ML IJ SOLN
1.0000 mg | INTRAMUSCULAR | Status: DC | PRN
  Administered 2015-06-25 (×3): 1 mg via INTRAVENOUS
  Filled 2015-06-25 (×3): qty 1

## 2015-06-25 MED ORDER — FENTANYL CITRATE (PF) 100 MCG/2ML IJ SOLN
INTRAMUSCULAR | Status: DC | PRN
  Administered 2015-06-25: 25 ug via INTRAVENOUS
  Administered 2015-06-25: 100 ug via INTRAVENOUS
  Administered 2015-06-25: 25 ug via INTRAVENOUS
  Administered 2015-06-25: 50 ug via INTRAVENOUS

## 2015-06-25 MED ORDER — ROCURONIUM BROMIDE 100 MG/10ML IV SOLN
INTRAVENOUS | Status: DC | PRN
  Administered 2015-06-25: 7.5 mg via INTRAVENOUS
  Administered 2015-06-25: 20 mg via INTRAVENOUS

## 2015-06-25 MED ORDER — GLYCOPYRROLATE 0.2 MG/ML IJ SOLN
INTRAMUSCULAR | Status: DC | PRN
  Administered 2015-06-25: .4 mg via INTRAVENOUS

## 2015-06-25 MED ORDER — LACTATED RINGERS IV SOLN
INTRAVENOUS | Status: DC | PRN
  Administered 2015-06-25: via INTRAVENOUS

## 2015-06-25 MED ORDER — ONDANSETRON HCL 4 MG/2ML IJ SOLN: 4.0000 mg | Freq: Once | INTRAMUSCULAR | Status: DC | PRN

## 2015-06-25 MED ORDER — NEOSTIGMINE METHYLSULFATE 10 MG/10ML IV SOLN
INTRAVENOUS | Status: DC | PRN
  Administered 2015-06-25: 2.5 mg via INTRAVENOUS

## 2015-06-25 MED ORDER — SODIUM CHLORIDE 0.9 % IV SOLN
INTRAVENOUS | Status: DC | PRN
  Administered 2015-06-25: 1000 mL via INTRAMUSCULAR

## 2015-06-25 MED ORDER — HYDROCODONE-ACETAMINOPHEN 5-325 MG PO TABS: 1.0000 | ORAL_TABLET | ORAL | Status: DC | PRN

## 2015-06-25 MED ORDER — PHENYLEPHRINE HCL 10 MG/ML IJ SOLN
INTRAMUSCULAR | Status: DC | PRN
  Administered 2015-06-25: 200 ug via INTRAVENOUS

## 2015-06-25 SURGICAL SUPPLY — 33 items
BLADE CLIPPER SURG (BLADE) ×3 IMPLANT
CANISTER SUCT 3000ML (MISCELLANEOUS) ×3 IMPLANT
CHLORAPREP W/TINT 26ML (MISCELLANEOUS) ×3 IMPLANT
CUTTER FLEX LINEAR 45M (STAPLE) ×3 IMPLANT
DRSG TEGADERM 2-3/8X2-3/4 SM (GAUZE/BANDAGES/DRESSINGS) ×3 IMPLANT
DRSG TELFA 3X8 NADH (GAUZE/BANDAGES/DRESSINGS) ×3 IMPLANT
GLOVE BIO SURGEON STRL SZ7.5 (GLOVE) ×3 IMPLANT
GLOVE INDICATOR 8.0 STRL GRN (GLOVE) ×3 IMPLANT
GOWN STRL REUS W/ TWL LRG LVL3 (GOWN DISPOSABLE) ×2 IMPLANT
GOWN STRL REUS W/TWL LRG LVL3 (GOWN DISPOSABLE) ×4
GRASPER SUT TROCAR 14GX15 (MISCELLANEOUS) ×3 IMPLANT
IRRIGATION STRYKERFLOW (MISCELLANEOUS) ×1 IMPLANT
IRRIGATOR STRYKERFLOW (MISCELLANEOUS) ×3
IV NS 1000ML (IV SOLUTION) ×2
IV NS 1000ML BAXH (IV SOLUTION) ×1 IMPLANT
KIT RM TURNOVER STRD PROC AR (KITS) ×3 IMPLANT
NEEDLE FILTER BLUNT 18X 1/2SAF (NEEDLE) ×2
NEEDLE FILTER BLUNT 18X1 1/2 (NEEDLE) ×1 IMPLANT
NEEDLE HYPO 25X1 1.5 SAFETY (NEEDLE) ×3 IMPLANT
NEEDLE INSUFFLATION 14GA 120MM (NEEDLE) ×3 IMPLANT
NS IRRIG 500ML POUR BTL (IV SOLUTION) ×3 IMPLANT
PACK LAP CHOLECYSTECTOMY (MISCELLANEOUS) ×3 IMPLANT
PAD GROUND ADULT SPLIT (MISCELLANEOUS) ×3 IMPLANT
POUCH ENDO CATCH 10MM SPEC (MISCELLANEOUS) ×6 IMPLANT
RELOAD 45 VASCULAR/THIN (ENDOMECHANICALS) ×9 IMPLANT
SCISSORS METZENBAUM CVD 33 (INSTRUMENTS) ×3 IMPLANT
SUT ETHILON 5-0 FS-2 18 BLK (SUTURE) ×6 IMPLANT
SUT VIC AB 0 CT2 27 (SUTURE) ×6 IMPLANT
SYRINGE 10CC LL (SYRINGE) ×3 IMPLANT
TROCAR XCEL 12X100 BLDLESS (ENDOMECHANICALS) ×6 IMPLANT
TROCAR Z-THREAD FIOS 11X100 BL (TROCAR) ×3 IMPLANT
TROCAR Z-THREAD SLEEVE 11X100 (TROCAR) ×3 IMPLANT
TUBING INSUFFLATOR HI FLOW (MISCELLANEOUS) ×3 IMPLANT

## 2015-06-25 NOTE — Op Note (Signed)
06/24/2015 - 06/25/2015  1:49 AM  PATIENT:  Erin Mercado  50 y.o. female  PRE-OPERATIVE DIAGNOSIS:  acute appendicitis  POST-OPERATIVE DIAGNOSIS:  acute appendicitis  PROCEDURE:  Procedure(s): APPENDECTOMY LAPAROSCOPIC (N/A)  SURGEON:  Surgeon(s) and Role:    * Dia Crawford III, MD - Primary   ASSISTANTS: None    ANESTHESIA:   general  EBL:  Total I/O In: 800 [I.V.:800] Out: 110 [Urine:100; Blood:10]   DRAINS: none   LOCAL MEDICATIONS USED:  MARCAINE      DISPOSITION OF SPECIMEN:  PATHOLOGY   DICTATION: .Dragon Dictation  Proper general anesthesia the patient's abdomen was prepped with ChloraPrep and draped sterile towels the patient was placed headdown feet up position. Small supraumbilical incision was made in the standard fashion and carried down through subcutaneous tissue bluntly. A varies needle was used to cannulate peritoneal cavity. CO2 was insufflated to appropriate pressure measurements. When approximately 2 L of CO2 were instilled the needle was withdrawn and an 11 mm port placed in the abdominal cavity. Intraperitoneal position was confirmed and CO2 was reinsufflated.  There were multiple midline adhesions from her umbilicus to the suprapubic area from her previous surgery. The right upper quadrant transverse incision was made 11 mm port inserted under direct vision. Adhesions were taken down in the right lower quadrant and a transverse right lower quadrant incision made under direct vision. A port was placed 12 mm in size in this area. The camera was moved to the upper port and dissection carried out through the 2 lower ports.  The appendix was identified in the base of the appendix visualized. The mesoappendix was taken down with combination of blunt, Bovie, and staple dissection using the Endo GIA 45 stapling device carrying a white load. The base of the appendix was identified and divided with a single application of the Endo GIA stapler carrying a blue  load. Appendix was captured in an Endo Catch apparatus and removed without difficulty. The abdomen was then copiously irrigated with liters of warm saline solution. The right lower quadrant and midline incisions were closed with figure-of-eight sutures of 0 Vicryl using the suture passer. Abdomen was desufflated. Skin incisions were all closed with 5-0 nylon. The area was infiltrated with 0.25% Marcaine for postoperative pain control. Sterile dressings were applied. The patient was returned to recovery room having tolerated the procedure well. Sponge instrument and needle count were correct 2 in the operative. PLAN OF CARE: observation  PATIENT DISPOSITION: Stable to PACU   Dia Crawford III, MD

## 2015-06-25 NOTE — Progress Notes (Signed)
Went over discharge instructions with patient and they were understood. Prescription given.  A&O. IV removed and catheter intact.  Husband helping patient and will drive home.  Christene Slates  06/25/2015   9:14 PM

## 2015-06-25 NOTE — Transfer of Care (Signed)
Immediate Anesthesia Transfer of Care Note  Patient: Erin Mercado  Procedure(s) Performed: Procedure(s): APPENDECTOMY LAPAROSCOPIC (N/A)  Patient Location: PACU  Anesthesia Type:General  Level of Consciousness: awake  Airway & Oxygen Therapy: Patient Spontanous Breathing and Patient connected to face mask oxygen  Post-op Assessment: Report given to RN and Post -op Vital signs reviewed and stable  Post vital signs: Reviewed and stable  Last Vitals:  Filed Vitals:   06/24/15 2313 06/25/15 0148  BP: 134/82   Pulse: 82   Temp: 36.6 C 36.5 C  Resp: 18     Complications: No apparent anesthesia complications

## 2015-06-25 NOTE — Anesthesia Procedure Notes (Signed)
Procedure Name: Intubation Date/Time: 06/25/2015 12:29 AM Performed by: Kennon Holter Pre-anesthesia Checklist: Timeout performed, Patient being monitored, Suction available, Emergency Drugs available and Patient identified Patient Re-evaluated:Patient Re-evaluated prior to inductionOxygen Delivery Method: Circle system utilized Preoxygenation: Pre-oxygenation with 100% oxygen Intubation Type: IV induction Laryngoscope Size: 2 and Miller Grade View: Grade I Tube type: Oral Laser Tube: Cuffed inflated with minimal occlusive pressure - saline Tube size: 7.0 mm Number of attempts: 1 Airway Equipment and Method: Stylet Placement Confirmation: ETT inserted through vocal cords under direct vision,  positive ETCO2 and breath sounds checked- equal and bilateral Secured at: 20 cm Tube secured with: Tape Dental Injury: Teeth and Oropharynx as per pre-operative assessment

## 2015-06-25 NOTE — Anesthesia Preprocedure Evaluation (Signed)
Anesthesia Evaluation  Patient identified by MRN, date of birth, ID band Patient awake    Reviewed: Allergy & Precautions, H&P , NPO status , Patient's Chart, lab work & pertinent test results, reviewed documented beta blocker date and time   Airway Mallampati: III  TM Distance: >3 FB Neck ROM: full    Dental  (+) Teeth Intact   Pulmonary neg pulmonary ROS, sleep apnea , Current Smoker,    Pulmonary exam normal        Cardiovascular Exercise Tolerance: Poor hypertension, + Peripheral Vascular Disease  negative cardio ROS Normal cardiovascular exam+ Valvular Problems/Murmurs MVP  Rhythm:regular Rate:Normal     Neuro/Psych PSYCHIATRIC DISORDERS Hx of carotid artery stenosis negative neurological ROS  negative psych ROS   GI/Hepatic negative GI ROS, Neg liver ROS,   Endo/Other  negative endocrine ROSHyperthyroidism   Renal/GU negative Renal ROS  negative genitourinary   Musculoskeletal   Abdominal   Peds  Hematology negative hematology ROS (+)   Anesthesia Other Findings   Reproductive/Obstetrics negative OB ROS                             Anesthesia Physical Anesthesia Plan  ASA: III and emergent  Anesthesia Plan: General ETT   Post-op Pain Management:    Induction:   Airway Management Planned:   Additional Equipment:   Intra-op Plan:   Post-operative Plan:   Informed Consent: I have reviewed the patients History and Physical, chart, labs and discussed the procedure including the risks, benefits and alternatives for the proposed anesthesia with the patient or authorized representative who has indicated his/her understanding and acceptance.     Plan Discussed with: CRNA  Anesthesia Plan Comments:         Anesthesia Quick Evaluation

## 2015-06-26 ENCOUNTER — Encounter: Payer: Self-pay | Admitting: General Surgery

## 2015-06-26 LAB — SURGICAL PATHOLOGY

## 2015-06-26 NOTE — Anesthesia Postprocedure Evaluation (Signed)
Anesthesia Post Note  Patient: Erin Mercado  Procedure(s) Performed: Procedure(s) (LRB): APPENDECTOMY LAPAROSCOPIC (N/A)  Patient location during evaluation: PACU Anesthesia Type: General Level of consciousness: awake and alert Pain management: pain level controlled Vital Signs Assessment: post-procedure vital signs reviewed and stable Respiratory status: spontaneous breathing, nonlabored ventilation, respiratory function stable and patient connected to nasal cannula oxygen Cardiovascular status: blood pressure returned to baseline and stable Postop Assessment: no signs of nausea or vomiting Anesthetic complications: no    Last Vitals:  Filed Vitals:   06/25/15 1210 06/25/15 1654  BP: 128/67 114/72  Pulse: 70 85  Temp: 36.8 C 36.9 C  Resp: 19 16    Last Pain:  Filed Vitals:   06/25/15 1813  PainSc: 0-No pain                 Molli Barrows

## 2015-06-28 ENCOUNTER — Telehealth: Payer: Self-pay

## 2015-06-28 NOTE — Telephone Encounter (Signed)
Post discharge call to patient made at this time. Pain is controlled at this time. No questions or concerns.   Confirmed patient appointment scheduled. Encouraged patient to call with any questions that arise prior to appointment.

## 2015-06-29 ENCOUNTER — Telehealth: Payer: Self-pay | Admitting: General Surgery

## 2015-06-29 NOTE — Discharge Summary (Signed)
Patient ID: Erin Mercado MRN: [id number] DOB/AGE: [id number] 48 y.o.  Admit date: 06/24/2015 Discharge date: 06/29/2015  Discharge Diagnoses:  Acute appendicitis  Procedures Performed: Laparoscopic appendectomy  Discharged Condition: stable  Hospital Course: She was admitted through the emergency room with evidence consistent with acute appendicitis. She had a clinical presentation and physical examination which were consistent with that diagnosis. CT scan demonstrated an enlarged appendix with evidence for periappendiceal stranding. With those findings surgery was recommended. She was taken to surgery where she underwent a laparoscopic appendectomy. The procedure was uncomplicated. She is no significant postoperative problems. She tolerated regular diet she's discharged home prefall the office in 7-10 days time.  Discharge Orders: Discharge Instructions    Call MD for:  difficulty breathing, headache or visual disturbances    Complete by:  As directed      Call MD for:  persistant nausea and vomiting    Complete by:  As directed      Call MD for:  redness, tenderness, or signs of infection (pain, swelling, redness, odor or green/yellow discharge around incision site)    Complete by:  As directed      Call MD for:  severe uncontrolled pain    Complete by:  As directed      Call MD for:  temperature >100.4    Complete by:  As directed      Diet general    Complete by:  As directed      Driving Restrictions    Complete by:  As directed   No driving while on prescription pain medication     Increase activity slowly    Complete by:  As directed      Lifting restrictions    Complete by:  As directed   No lifting over 15lbs for 3 weeks.     May shower / Bathe    Complete by:  As directed      Remove dressing in 48 hours    Complete by:  As directed            Disposition: 01-Home or Self Care  Discharge Medications: No current facility-administered medications for  this encounter.  Current outpatient prescriptions:  .  ALPRAZolam (XANAX) 0.5 MG tablet, Take 0.25 mg by mouth daily as needed for anxiety. Reported on 06/24/2015, Disp: , Rfl:  .  amLODipine (NORVASC) 10 MG tablet, Take 1 tablet by mouth daily., Disp: , Rfl:  .  estradiol (CLIMARA - DOSED IN MG/24 HR) 0.075 mg/24hr, Place 1 patch (0.075 mg total) onto the skin once a week. For estrogen replacement. SATURDAY (Patient taking differently: Place 1 patch onto the skin once a week. Pt uses on Monday.), Disp: 4 patch, Rfl: 0 .  HYDROcodone-acetaminophen (NORCO/VICODIN) 5-325 MG per tablet, Take 1 tablet by mouth every 6 (six) hours as needed for moderate pain or severe pain., Disp: 10 tablet, Rfl: 0 .  ibuprofen (ADVIL,MOTRIN) 200 MG tablet, Take 200 mg by mouth every 6 (six) hours as needed for headache., Disp: , Rfl:  .  levothyroxine (SYNTHROID, LEVOTHROID) 50 MCG tablet, Take 1 tablet by mouth daily., Disp: , Rfl:  .  lisinopril (PRINIVIL,ZESTRIL) 40 MG tablet, Take 40 mg by mouth daily., Disp: , Rfl:  .  metoprolol succinate (TOPROL-XL) 50 MG 24 hr tablet, Take 1 tablet by mouth at bedtime., Disp: , Rfl:  .  sertraline (ZOLOFT) 50 MG tablet, Take 50 mg by mouth daily., Disp: , Rfl:  .  Cholecalciferol (VITAMIN D) 400  UNITS capsule, Take 2 capsules (800 Units total) by mouth daily. For Vitamin D replacement and mood control. (Patient not taking: Reported on 12/05/2014), Disp: 60 capsule, Rfl: 0 .  HYDROcodone-acetaminophen (NORCO/VICODIN) 5-325 MG tablet, Take 1-2 tablets by mouth every 4 (four) hours as needed for moderate pain., Disp: 30 tablet, Rfl: 0  Follwup: Follow-up Information    Call [facility].   Specialty:  General Surgery   Why:  tomorrow AM to make appointment to see surgeon in 2 weeks, postop Appy   Contact information:   [place] [phone]      Signed: Dia Crawford III 06/29/2015, 5:09 PM

## 2015-06-29 NOTE — Telephone Encounter (Signed)
Patient would like a note to be out of work till her follow up appointment with Dr Adonis Huguenin on 08/05/15 in [place]. She doesn't feel as if she can go back to work at this time. Patient works at [employer]. She will have [employer] fax FMLA papers to our office.

## 2015-06-29 NOTE — Telephone Encounter (Signed)
Note printed at this time. Patient does not want me to fax this. She would like to come pick this up. This has been placed at the front desk in [place] and patient states that she will pick up Monday.

## 2015-07-03 ENCOUNTER — Telehealth: Payer: Self-pay

## 2015-07-03 NOTE — Telephone Encounter (Signed)
Disability Form has been filled out and faxed.

## 2015-07-05 ENCOUNTER — Encounter: Payer: Self-pay | Admitting: General Surgery

## 2015-07-05 ENCOUNTER — Ambulatory Visit (INDEPENDENT_AMBULATORY_CARE_PROVIDER_SITE_OTHER): Payer: 59 | Admitting: General Surgery

## 2015-07-05 VITALS — BP 135/87 | HR 75 | Temp 98.2°F | Ht 59.0 in | Wt 135.0 lb

## 2015-07-05 DIAGNOSIS — Z4889 Encounter for other specified surgical aftercare: Secondary | ICD-10-CM

## 2015-07-05 MED ORDER — HYDROCODONE-ACETAMINOPHEN 10-325 MG PO TABS: 1.0000 | ORAL_TABLET | Freq: Four times a day (QID) | ORAL | Status: DC | PRN

## 2015-07-05 NOTE — Patient Instructions (Addendum)
Please call our office with any questions or concerns.  Please do not submerge in a tub, hot tub, or pool until incisions are completely sealed.  Use sun block to incision area over the next year if this area will be exposed to sun. This helps decrease scarring.  You may now resume your normal activities. Listen to your body when lifting, if you have pain when lifting, stop and then try again in a few days.  If you develop redness, drainage, or pain at incision sites- call our office immediately and speak with a nurse.   Call our office if you want to go back to work prior to the 07/23/15 date that you have been placed out until.

## 2015-07-05 NOTE — Progress Notes (Signed)
Outpatient Surgical Follow Up  07/05/2015  Erin Mercado is an 50 y.o. female.   Chief Complaint  Patient presents with  . Routine Post Op    Laparoscopic Appendectomy (1/15)- Dr. Azalee Course    HPI:  50 year old female returns to clinic status post laparoscopic appendectomy. She reports gradual improvement. She denies any fevers, chills, nausea, vomiting, diarrhea, constipation. She is having intermittent pains requiring the occasional pain medication still. She is been tolerating regular diet and having regular bowel function. She does not quite feel rate effect work yet.  Past Medical History  Diagnosis Date  . Hypertension   . Heart murmur   . Carotid artery stenosis, unilateral   . Thyroid disease   . Anxiety   . Sleep apnea   . Hyperthyroidism   . Ovarian cancer ([place]) 1991    [facility]-chemo & oopherectomy  . Hypercholesteremia   . Hyperglycemia   . Pancreatitis     Past Surgical History  Procedure Laterality Date  . Abdominal hysterectomy      complete   . Abscess drainage    . Laparoscopic appendectomy N/A 06/25/2015    Procedure: APPENDECTOMY LAPAROSCOPIC;  Surgeon: Dia Crawford III, MD;  Location: [facility] ORS;  Service: General;  Laterality: N/A;    Family History  Problem Relation Age of Onset  . Hypertension Father   . Diabetes Father   . Colon cancer Neg Hx   . Liver disease Neg Hx     Social History:  reports that she has been smoking Cigarettes.  She has a 27 pack-year smoking history. She has never used smokeless tobacco. She reports that she drinks alcohol. She reports that she does not use illicit drugs.  Allergies: No Known Allergies  Medications reviewed.    ROS  a multipoint review of systems was completed, all pertinent positives and negatives are documented within the history of present illness and the remainder are negative.   BP 135/87 mmHg  Pulse 75  Temp(Src) 98.2 F (36.8 C) (Oral)  Ht 4\' 11"  (1.499 m)  Wt 61.236 kg (135 lb)  BMI  27.25 kg/m2  LMP 07/29/2001  Physical Exam   Gen.: No acute distress Chest: Clear to auscultation Heart: Regular rate and rhythm Abdomen: Soft,  Appropriately tender to palpation at the incision sites, nondistended. Laparoscopic incisions are well approximated without any evidence of erythema or drainage.   No results found for this or any previous visit (from the past 48 hour(s)). No results found.  Assessment/Plan:  1. Aftercare following surgery  50 year old female status post laparoscopic appendectomy. Pathology reviewed with the patient. Anticipated return to normal function and pain relief again discussed. Discussed signs and symptoms of infection to any for sites and for her to return to clinic immediately should she have any concerns. Otherwise she'll follow up on an as-needed basis.     Clayburn Pert, MD FACS General Surgeon  07/05/2015,3:43 PM

## 2015-07-12 ENCOUNTER — Telehealth: Payer: Self-pay | Admitting: Surgery

## 2015-07-12 NOTE — Telephone Encounter (Signed)
Patient has called and is requesting a note to return to work. Patient had surgery on 06/25/15 with Dr Pat Patrick for laparoscopic appendectomy. She had her follow up appointment on 07/05/15 with Dr Adonis Huguenin. Patient would like to return to work on 07/16/15. Patient would like to pick this up today in the [facility] office if it could be done today. Please contact patient when letter is ready for pick up. [phone].

## 2015-07-12 NOTE — Telephone Encounter (Signed)
Called patient back in reference to her request in returning back to work on 07/16/2015. I asked patient if she does any lifting more than 15 lbs. Patient stated that once in a while she might but she will not lift anything since she will ask her co-workers to help her for the next two weeks. Patient was also asked if she continued taking pain medications and she stated that she took one last yesterday. I told her that she was not to go back to work if she was still taking pain medications. So, I told her that in order for her to go back to work, she could not take pain medication (narcotics). However, if she needed something to help her with her pain, then she was able to take Ibuprofen 600 MG every 8 hours alternating with Acetaminophen 500 MG. Patient stated that since she wanted to go back to work on Monday 07/16/2015, then she agreed on taking ibuprofen and tylenol. Therefore, I told her that I would write her the letter that she is requesting and it will be left at the front desk at our [place] office. Patient stated that she will come later on today and/or tomorrow morning.

## 2015-11-14 ENCOUNTER — Ambulatory Visit
Admission: EM | Admit: 2015-11-14 | Discharge: 2015-11-14 | Disposition: A | Discharge status: Home / Self Care | Payer: 59 | Attending: Family Medicine | Admitting: Family Medicine

## 2015-11-14 ENCOUNTER — Encounter: Payer: Self-pay | Admitting: *Deleted

## 2015-11-14 DIAGNOSIS — L0291 Cutaneous abscess, unspecified: Secondary | ICD-10-CM

## 2015-11-14 MED ORDER — SULFAMETHOXAZOLE-TRIMETHOPRIM 800-160 MG PO TABS: 1.0000 | ORAL_TABLET | Freq: Two times a day (BID) | ORAL | Status: AC

## 2015-11-14 MED ORDER — HYDROCODONE-ACETAMINOPHEN 5-325 MG PO TABS: 1.0000 | ORAL_TABLET | Freq: Four times a day (QID) | ORAL | Status: DC | PRN

## 2015-11-14 NOTE — ED Provider Notes (Signed)
CSN: [id number]     Arrival date & time 11/14/15  1419 History   First MD Initiated Contact with Patient 11/14/15 1504     Chief Complaint  Patient presents with  . Abscess   (Consider location/radiation/quality/duration/timing/severity/associated sxs/prior Treatment) HPI: Patient presents today with symptoms of small abscess on left labia. Patient states that she has had a history of this previously. She denies any discharge from the area. She denies any fever. She denies any vaginal discharge or bleeding. She has a history of a hysterectomy. She thinks it is due to shaving the area. She denies any hx of STDS.  Past Medical History  Diagnosis Date  . Hypertension   . Heart murmur   . Carotid artery stenosis, unilateral   . Thyroid disease   . Anxiety   . Sleep apnea   . Hyperthyroidism   . Ovarian cancer ([place]) 1991    [facility]-chemo & oopherectomy  . Hypercholesteremia   . Hyperglycemia   . Pancreatitis    Past Surgical History  Procedure Laterality Date  . Abdominal hysterectomy      complete   . Abscess drainage    . Laparoscopic appendectomy N/A 06/25/2015    Procedure: APPENDECTOMY LAPAROSCOPIC;  Surgeon: Dia Crawford III, MD;  Location: [facility] ORS;  Service: General;  Laterality: N/A;   Family History  Problem Relation Age of Onset  . Hypertension Father   . Diabetes Father   . Colon cancer Neg Hx   . Liver disease Neg Hx    Social History  Substance Use Topics  . Smoking status: Current Every Day Smoker -- 1.00 packs/day for 27 years    Types: Cigarettes  . Smokeless tobacco: Never Used  . Alcohol Use: 0.0 oz/week    0 Standard drinks or equivalent per week     Comment: rare, couple drinks per year   OB History    No data available     Review of Systems: Negative except mentioned above.  Allergies  Review of patient's allergies indicates no known allergies.  Home Medications   Prior to Admission medications   Medication Sig Start Date End Date Taking?  Authorizing Provider  ALPRAZolam Duanne Moron) 0.5 MG tablet Take 0.25 mg by mouth daily as needed for anxiety. Reported on 06/24/2015   Yes Historical Provider, MD  amLODipine (NORVASC) 10 MG tablet Take 1 tablet by mouth daily. 03/07/15 03/06/16 Yes Historical Provider, MD  estradiol (CLIMARA - DOSED IN MG/24 HR) 0.075 mg/24hr Place 1 patch (0.075 mg total) onto the skin once a week. For estrogen replacement. SATURDAY Patient taking differently: Place 1 patch onto the skin once a week. Pt uses on Monday. 08/01/11  Yes Darrol Jump, MD  lisinopril (PRINIVIL,ZESTRIL) 40 MG tablet Take 40 mg by mouth daily.   Yes Historical Provider, MD  metoprolol succinate (TOPROL-XL) 50 MG 24 hr tablet Take 1 tablet by mouth at bedtime. 05/14/15  Yes Historical Provider, MD  sertraline (ZOLOFT) 50 MG tablet Take 50 mg by mouth daily.   Yes Historical Provider, MD  HYDROcodone-acetaminophen (NORCO) 10-325 MG tablet Take 1 tablet by mouth every 6 (six) hours as needed. 07/05/15   Clayburn Pert, MD   Meds Ordered and Administered this Visit  Medications - No data to display  BP 141/86 mmHg  Pulse 96  Temp(Src) 98.1 F (36.7 C) (Oral)  Ht 4\' 10"  (1.473 m)  Wt 135 lb (61.236 kg)  BMI 28.22 kg/m2  SpO2 96%  LMP 07/29/2001 No data found.  Physical Exam   GENERAL: NAD HEENT: no pharyngeal erythema, no exudate RESP: CTA B CARD: RRR GU: dime sized area of erythema and swelling with whitehead at top of left labia, mild to moderate tenderness of the area, no discharge  NEURO: CN II-XII grossly intact   ED Course  Procedures (including critical care time)  Labs Review Labs Reviewed - No data to display  Imaging Review No results found.    MDM  A/P: Abcess Left Labia- Encouraged warm compresses to the area, Bactrim DS, follow-up with GYN if symptoms persist or worsen as discussed. I have advised the patient not to shave the area until area completely healed and to use a new razor.     Paulina Fusi,  MD 11/14/15 [phone]

## 2015-11-14 NOTE — ED Notes (Signed)
Pt states that abscess appeared yesterday on left outer labia. Previous history of abscess in genital region. Pt states no discharge from abscess at this time.

## 2016-04-13 ENCOUNTER — Ambulatory Visit
Admission: EM | Admit: 2016-04-13 | Discharge: 2016-04-13 | Disposition: A | Discharge status: Home / Self Care | Payer: BLUE CROSS/BLUE SHIELD | Attending: Family Medicine | Admitting: Family Medicine

## 2016-04-13 ENCOUNTER — Encounter: Payer: Self-pay | Admitting: Emergency Medicine

## 2016-04-13 DIAGNOSIS — R05 Cough: Secondary | ICD-10-CM

## 2016-04-13 DIAGNOSIS — J40 Bronchitis, not specified as acute or chronic: Secondary | ICD-10-CM | POA: Diagnosis not present

## 2016-04-13 DIAGNOSIS — R059 Cough, unspecified: Secondary | ICD-10-CM

## 2016-04-13 DIAGNOSIS — J9801 Acute bronchospasm: Secondary | ICD-10-CM

## 2016-04-13 MED ORDER — PREDNISONE 20 MG PO TABS: 20.0000 mg | ORAL_TABLET | Freq: Every day | ORAL | 0 refills | Status: DC

## 2016-04-13 MED ORDER — ALBUTEROL SULFATE HFA 108 (90 BASE) MCG/ACT IN AERS: 1.0000 | INHALATION_SPRAY | Freq: Four times a day (QID) | RESPIRATORY_TRACT | 0 refills | Status: AC | PRN

## 2016-04-13 MED ORDER — GUAIFENESIN-CODEINE 100-10 MG/5ML PO SOLN
ORAL | 0 refills | Status: DC
Start: 2016-04-13 — End: 2016-07-16

## 2016-04-13 MED ORDER — AZITHROMYCIN 250 MG PO TABS: ORAL_TABLET | ORAL | 0 refills | Status: DC

## 2016-04-13 NOTE — ED Provider Notes (Signed)
[facility]    CSN: [id number] Arrival date & time: 04/13/16  1410     History   Chief Complaint Chief Complaint  Patient presents with  . Cough    HPI Erin Mercado is a 50 y.o. female.   The history is provided by the patient.  URI  Presenting symptoms: congestion, cough and fatigue   Severity:  Moderate Onset quality:  Sudden Duration:  2 weeks Timing:  Constant Progression:  Worsening Chronicity:  New Relieved by:  Nothing Ineffective treatments:  OTC medications Associated symptoms: wheezing   Risk factors: not elderly, no chronic cardiac disease, no chronic kidney disease, no diabetes mellitus, no immunosuppression, no recent illness, no recent travel and no sick contacts  Chronic respiratory disease: unknown; however chronic smoker.     Past Medical History:  Diagnosis Date  . Anxiety   . Carotid artery stenosis, unilateral   . Heart murmur   . Hypercholesteremia   . Hyperglycemia   . Hypertension   . Hyperthyroidism   . Ovarian cancer ([place]) 1991   [facility]-chemo & oopherectomy  . Pancreatitis   . Sleep apnea   . Thyroid disease     Patient Active Problem List   Diagnosis Date Noted  . Fatty infiltration of liver 03/08/2015  . Chronic gastritis 03/06/2015  . Chronic diarrhea   . Acute pancreatitis 12/05/2014  . Kidney lump 09/24/2014  . Anxiety 03/02/2012  . H/O medication noncompliance 03/02/2012  . Cardiac murmur 03/02/2012  . H/O endocrine disorder 03/02/2012  . Hidradenitis 03/02/2012  . Irregular cardiac rhythm 03/02/2012  . Other specified symptoms and signs involving the circulatory and respiratory systems 03/02/2012  . Apnea, sleep 03/02/2012  . Avitaminosis D 03/02/2012  . Major depressive disorder, recurrent episode 07/31/2011    Class: Acute  . Adult hypothyroidism 07/15/2011  . Breast cyst 12/20/2010  . Benign essential HTN 12/20/2010  . H/O ovarian cancer 12/20/2010  . HLD (hyperlipidemia) 12/20/2010     Past Surgical History:  Procedure Laterality Date  . ABDOMINAL HYSTERECTOMY     complete   . ABSCESS DRAINAGE    . LAPAROSCOPIC APPENDECTOMY N/A 06/25/2015   Procedure: APPENDECTOMY LAPAROSCOPIC;  Surgeon: Dia Crawford III, MD;  Location: [facility] ORS;  Service: General;  Laterality: N/A;    OB History    No data available       Home Medications    Prior to Admission medications   Medication Sig Start Date End Date Taking? Authorizing Provider  albuterol (PROVENTIL HFA;VENTOLIN HFA) 108 (90 Base) MCG/ACT inhaler Inhale 1-2 puffs into the lungs every 6 (six) hours as needed for wheezing or shortness of breath. 04/13/16   Norval Gable, MD  ALPRAZolam Duanne Moron) 0.5 MG tablet Take 0.25 mg by mouth daily as needed for anxiety. Reported on 06/24/2015    Historical Provider, MD  amLODipine (NORVASC) 10 MG tablet Take 1 tablet by mouth daily. 03/07/15 03/06/16  Historical Provider, MD  azithromycin (ZITHROMAX Z-PAK) 250 MG tablet 2 tabs po once day 1, then 1 tab po qd for next 4 days 04/13/16   Norval Gable, MD  estradiol (CLIMARA - DOSED IN MG/24 HR) 0.075 mg/24hr Place 1 patch (0.075 mg total) onto the skin once a week. For estrogen replacement. SATURDAY Patient taking differently: Place 1 patch onto the skin once a week. Pt uses on Monday. 08/01/11   Darrol Jump, MD  guaiFENesin-codeine 100-10 MG/5ML syrup 10 ml po qhs prn cough 04/13/16   Norval Gable, MD  HYDROcodone-acetaminophen (NORCO/VICODIN)  5-325 MG tablet Take 1 tablet by mouth every 6 (six) hours as needed for moderate pain or severe pain. 11/14/15   Paulina Fusi, MD  lisinopril (PRINIVIL,ZESTRIL) 40 MG tablet Take 40 mg by mouth daily.    Historical Provider, MD  metoprolol succinate (TOPROL-XL) 50 MG 24 hr tablet Take 1 tablet by mouth at bedtime. 05/14/15   Historical Provider, MD  predniSONE (DELTASONE) 20 MG tablet Take 1 tablet (20 mg total) by mouth daily. 04/13/16   Norval Gable, MD  sertraline (ZOLOFT) 50 MG tablet Take 50 mg by  mouth daily.    Historical Provider, MD    Family History Family History  Problem Relation Age of Onset  . Hypertension Father   . Diabetes Father   . Colon cancer Neg Hx   . Liver disease Neg Hx     Social History Social History  Substance Use Topics  . Smoking status: Current Every Day Smoker    Packs/day: 1.00    Years: 27.00    Types: Cigarettes  . Smokeless tobacco: Never Used  . Alcohol use 0.0 oz/week     Comment: rare, couple drinks per year     Allergies   Patient has no known allergies.   Review of Systems Review of Systems  Constitutional: Positive for fatigue.  HENT: Positive for congestion.   Respiratory: Positive for cough and wheezing.      Physical Exam Triage Vital Signs ED Triage Vitals  Enc Vitals Group     BP 04/13/16 1432 (!) 161/85     Pulse Rate 04/13/16 1432 79     Resp 04/13/16 1432 16     Temp 04/13/16 1432 98.7 F (37.1 C)     Temp Source 04/13/16 1432 Oral     SpO2 04/13/16 1432 97 %     Weight 04/13/16 1431 135 lb (61.2 kg)     Height 04/13/16 1431 4\' 10"  (1.473 m)     Head Circumference --      Peak Flow --      Pain Score 04/13/16 1432 0     Pain Loc --      Pain Edu? --      Excl. in [place]? --    No data found.   Updated Vital Signs BP (!) 161/85 (BP Location: Left Arm)   Pulse 79   Temp 98.7 F (37.1 C) (Oral)   Resp 16   Ht 4\' 10"  (1.473 m)   Wt 135 lb (61.2 kg)   LMP 07/29/2001   SpO2 97%   BMI 28.22 kg/m   Visual Acuity Right Eye Distance:   Left Eye Distance:   Bilateral Distance:    Right Eye Near:   Left Eye Near:    Bilateral Near:     Physical Exam  Constitutional: She appears well-developed and well-nourished. No distress.  HENT:  Head: Normocephalic and atraumatic.  Right Ear: Tympanic membrane, external ear and ear canal normal.  Left Ear: Tympanic membrane, external ear and ear canal normal.  Nose: No mucosal edema, rhinorrhea, nose lacerations, sinus tenderness, nasal deformity, septal  deviation or nasal septal hematoma. No epistaxis.  No foreign bodies. Right sinus exhibits no maxillary sinus tenderness and no frontal sinus tenderness. Left sinus exhibits no maxillary sinus tenderness and no frontal sinus tenderness.  Mouth/Throat: Uvula is midline, oropharynx is clear and moist and mucous membranes are normal. No oropharyngeal exudate.  Eyes: Conjunctivae and EOM are normal. Pupils are equal, round, and reactive to light. Right  eye exhibits no discharge. Left eye exhibits no discharge. No scleral icterus.  Neck: Normal range of motion. Neck supple. No thyromegaly present.  Cardiovascular: Normal rate, regular rhythm and normal heart sounds.   Pulmonary/Chest: Effort normal. No respiratory distress. She has wheezes (diffusely, plus rhonchi). She has no rales.  Lymphadenopathy:    She has no cervical adenopathy.  Skin: She is not diaphoretic.  Nursing note and vitals reviewed.    UC Treatments / Results  Labs (all labs ordered are listed, but only abnormal results are displayed) Labs Reviewed - No data to display  EKG  EKG Interpretation None       Radiology No results found.  Procedures Procedures (including critical care time)  Medications Ordered in UC Medications - No data to display   Initial Impression / Assessment and Plan / UC Course  I have reviewed the triage vital signs and the nursing notes.  Pertinent labs & imaging results that were available during my care of the patient were reviewed by me and considered in my medical decision making (see chart for details).  Clinical Course       Final Clinical Impressions(s) / UC Diagnoses   Final diagnoses:  Cough  Bronchospasm  Bronchitis    New Prescriptions Discharge Medication List as of 04/13/2016  3:07 PM    START taking these medications   Details  albuterol (PROVENTIL HFA;VENTOLIN HFA) 108 (90 Base) MCG/ACT inhaler Inhale 1-2 puffs into the lungs every 6 (six) hours as needed for  wheezing or shortness of breath., Starting Sun 04/13/2016, Normal    azithromycin (ZITHROMAX Z-PAK) 250 MG tablet 2 tabs po once day 1, then 1 tab po qd for next 4 days, Normal    guaiFENesin-codeine 100-10 MG/5ML syrup 10 ml po qhs prn cough, Print    predniSONE (DELTASONE) 20 MG tablet Take 1 tablet (20 mg total) by mouth daily., Starting Sun 04/13/2016, Normal       1. diagnosis reviewed with patient 2. rx as per orders above; reviewed possible side effects, interactions, risks and benefits  3. Recommend supportive treatment with increased fluids, rest 4. Follow-up prn if symptoms worsen or don't improve   Norval Gable, MD 04/13/16 1512

## 2016-04-13 NOTE — ED Triage Notes (Signed)
Patient c/o cough and chest congestion for 2 weeks.   

## 2016-04-16 ENCOUNTER — Telehealth: Payer: Self-pay | Admitting: *Deleted

## 2016-07-16 ENCOUNTER — Ambulatory Visit
Admission: EM | Admit: 2016-07-16 | Discharge: 2016-07-16 | Disposition: A | Discharge status: Home / Self Care | Payer: BLUE CROSS/BLUE SHIELD | Attending: Family Medicine | Admitting: Family Medicine

## 2016-07-16 DIAGNOSIS — R059 Cough, unspecified: Secondary | ICD-10-CM

## 2016-07-16 DIAGNOSIS — F17200 Nicotine dependence, unspecified, uncomplicated: Secondary | ICD-10-CM

## 2016-07-16 DIAGNOSIS — R05 Cough: Secondary | ICD-10-CM | POA: Diagnosis not present

## 2016-07-16 DIAGNOSIS — J40 Bronchitis, not specified as acute or chronic: Secondary | ICD-10-CM

## 2016-07-16 LAB — RAPID STREP SCREEN (MED CTR MEBANE ONLY): STREPTOCOCCUS, GROUP A SCREEN (DIRECT): NEGATIVE

## 2016-07-16 MED ORDER — AZITHROMYCIN 250 MG PO TABS: ORAL_TABLET | ORAL | 0 refills | Status: AC

## 2016-07-16 MED ORDER — GUAIFENESIN-CODEINE 100-10 MG/5ML PO SOLN: ORAL | 0 refills | Status: AC

## 2016-07-16 NOTE — ED Triage Notes (Signed)
Patient complains of sore throat that started last week and improved but has since returned over the last 2 days. Patient states that she has also been coughing that is worse at night. Patient states that cough has been keeping her up.

## 2016-07-16 NOTE — ED Provider Notes (Addendum)
[facility]    CSN: [id number] Arrival date & time: 07/16/16  1828     History   Chief Complaint Chief Complaint  Patient presents with  . Sore Throat    HPI Erin Mercado is a 51 y.o. female.   The history is provided by the patient.  Sore Throat   URI  Presenting symptoms: cough, rhinorrhea and sore throat   Severity:  Moderate Duration:  1 week Timing:  Constant Progression:  Worsening Chronicity:  New Relieved by:  Nothing Ineffective treatments:  OTC medications Associated symptoms: wheezing (mild)   Associated symptoms: no myalgias and no sinus pain   Risk factors: sick contacts   Risk factors: not elderly, no chronic cardiac disease, no chronic kidney disease, no immunosuppression, no recent illness and no recent travel  Chronic respiratory disease: unknown; chronic smoker.     Past Medical History:  Diagnosis Date  . Anxiety   . Carotid artery stenosis, unilateral   . Heart murmur   . Hypercholesteremia   . Hyperglycemia   . Hypertension   . Hyperthyroidism   . Ovarian cancer ([place]) 1991   [facility]-chemo & oopherectomy  . Pancreatitis   . Sleep apnea   . Thyroid disease     Patient Active Problem List   Diagnosis Date Noted  . Fatty infiltration of liver 03/08/2015  . Chronic gastritis 03/06/2015  . Chronic diarrhea   . Acute pancreatitis 12/05/2014  . Kidney lump 09/24/2014  . Anxiety 03/02/2012  . H/O medication noncompliance 03/02/2012  . Cardiac murmur 03/02/2012  . H/O endocrine disorder 03/02/2012  . Hidradenitis 03/02/2012  . Irregular cardiac rhythm 03/02/2012  . Other specified symptoms and signs involving the circulatory and respiratory systems 03/02/2012  . Apnea, sleep 03/02/2012  . Avitaminosis D 03/02/2012  . Major depressive disorder, recurrent episode 07/31/2011    Class: Acute  . Adult hypothyroidism 07/15/2011  . Breast cyst 12/20/2010  . Benign essential HTN 12/20/2010  . H/O ovarian cancer 12/20/2010    . HLD (hyperlipidemia) 12/20/2010    Past Surgical History:  Procedure Laterality Date  . ABDOMINAL HYSTERECTOMY     complete   . ABSCESS DRAINAGE    . LAPAROSCOPIC APPENDECTOMY N/A 06/25/2015   Procedure: APPENDECTOMY LAPAROSCOPIC;  Surgeon: Dia Crawford III, MD;  Location: [facility] ORS;  Service: General;  Laterality: N/A;    OB History    No data available       Home Medications    Prior to Admission medications   Medication Sig Start Date End Date Taking? Authorizing Provider  albuterol (PROVENTIL HFA;VENTOLIN HFA) 108 (90 Base) MCG/ACT inhaler Inhale 1-2 puffs into the lungs every 6 (six) hours as needed for wheezing or shortness of breath. 04/13/16  Yes Norval Gable, MD  ALPRAZolam Duanne Moron) 0.5 MG tablet Take 0.25 mg by mouth daily as needed for anxiety. Reported on 06/24/2015   Yes Historical Provider, MD  amLODipine (NORVASC) 10 MG tablet Take 1 tablet by mouth daily. 03/07/15 07/16/16 Yes Historical Provider, MD  estradiol (CLIMARA - DOSED IN MG/24 HR) 0.075 mg/24hr Place 1 patch (0.075 mg total) onto the skin once a week. For estrogen replacement. SATURDAY Patient taking differently: Place 1 patch onto the skin once a week. Pt uses on Monday. 08/01/11  Yes Darrol Jump, MD  losartan (COZAAR) 50 MG tablet Take 50 mg by mouth daily.   Yes Historical Provider, MD  metoprolol succinate (TOPROL-XL) 50 MG 24 hr tablet Take 1 tablet by mouth at bedtime. 05/14/15  Yes Historical Provider, MD  sertraline (ZOLOFT) 50 MG tablet Take 50 mg by mouth daily.   Yes Historical Provider, MD  azithromycin (ZITHROMAX Z-PAK) 250 MG tablet 2 tabs po once day 1, then 1 tab po qd for next 4 days 07/16/16   Norval Gable, MD  guaiFENesin-codeine 100-10 MG/5ML syrup 10 ml po qhs prn cough 07/16/16   Norval Gable, MD  HYDROcodone-acetaminophen (NORCO/VICODIN) 5-325 MG tablet Take 1 tablet by mouth every 6 (six) hours as needed for moderate pain or severe pain. 11/14/15   Paulina Fusi, MD  lisinopril  (PRINIVIL,ZESTRIL) 40 MG tablet Take 40 mg by mouth daily.    Historical Provider, MD  predniSONE (DELTASONE) 20 MG tablet Take 1 tablet (20 mg total) by mouth daily. 04/13/16   Norval Gable, MD    Family History Family History  Problem Relation Age of Onset  . Hypertension Father   . Diabetes Father   . Colon cancer Neg Hx   . Liver disease Neg Hx     Social History Social History  Substance Use Topics  . Smoking status: Current Every Day Smoker    Packs/day: 1.00    Years: 27.00    Types: Cigarettes  . Smokeless tobacco: Never Used  . Alcohol use 0.0 oz/week     Comment: rare, couple drinks per year     Allergies   Patient has no known allergies.   Review of Systems Review of Systems  HENT: Positive for rhinorrhea and sore throat. Negative for sinus pain.   Respiratory: Positive for cough and wheezing (mild).   Musculoskeletal: Negative for myalgias.     Physical Exam Triage Vital Signs ED Triage Vitals  Enc Vitals Group     BP 07/16/16 1849 (!) 147/79     Pulse Rate 07/16/16 1849 72     Resp 07/16/16 1849 18     Temp 07/16/16 1849 97.8 F (36.6 C)     Temp Source 07/16/16 1849 Oral     SpO2 07/16/16 1849 98 %     Weight 07/16/16 1846 135 lb (61.2 kg)     Height 07/16/16 1846 4\' 10"  (1.473 m)     Head Circumference --      Peak Flow --      Pain Score 07/16/16 1848 5     Pain Loc --      Pain Edu? --      Excl. in [place]? --    No data found.   Updated Vital Signs BP (!) 147/79 (BP Location: Left Arm)   Pulse 72   Temp 97.8 F (36.6 C) (Oral)   Resp 18   Ht 4\' 10"  (1.473 m)   Wt 135 lb (61.2 kg)   LMP 07/29/2001   SpO2 98%   BMI 28.22 kg/m   Visual Acuity Right Eye Distance:   Left Eye Distance:   Bilateral Distance:    Right Eye Near:   Left Eye Near:    Bilateral Near:     Physical Exam  Constitutional: She appears well-developed and well-nourished. No distress.  HENT:  Head: Normocephalic and atraumatic.  Right Ear: Tympanic  membrane, external ear and ear canal normal.  Left Ear: Tympanic membrane, external ear and ear canal normal.  Nose: Mucosal edema and rhinorrhea present. No nose lacerations, sinus tenderness, nasal deformity, septal deviation or nasal septal hematoma. No epistaxis.  No foreign bodies. Right sinus exhibits no maxillary sinus tenderness and no frontal sinus tenderness. Left sinus exhibits no maxillary sinus  tenderness and no frontal sinus tenderness.  Mouth/Throat: Uvula is midline, oropharynx is clear and moist and mucous membranes are normal. No oropharyngeal exudate.  Eyes: Conjunctivae and EOM are normal. Pupils are equal, round, and reactive to light. Right eye exhibits no discharge. Left eye exhibits no discharge. No scleral icterus.  Neck: Normal range of motion. Neck supple. No thyromegaly present.  Cardiovascular: Normal rate, regular rhythm and normal heart sounds.   Pulmonary/Chest: Effort normal. No respiratory distress. She has no wheezes. She has rales (left base).  Lymphadenopathy:    She has no cervical adenopathy.  Skin: She is not diaphoretic.  Nursing note and vitals reviewed.    UC Treatments / Results  Labs (all labs ordered are listed, but only abnormal results are displayed) Labs Reviewed  RAPID STREP SCREEN (NOT AT [facility])  CULTURE, GROUP A STREP [facility])    EKG  EKG Interpretation None       Radiology No results found.  Procedures Procedures (including critical care time)  Medications Ordered in UC Medications - No data to display   Initial Impression / Assessment and Plan / UC Course  I have reviewed the triage vital signs and the nursing notes.  Pertinent labs & imaging results that were available during my care of the patient were reviewed by me and considered in my medical decision making (see chart for details).       Final Clinical Impressions(s) / UC Diagnoses   Final diagnoses:  Cough  Bronchitis  Current smoker    New  Prescriptions Discharge Medication List as of 07/16/2016  8:21 PM     1. diagnosis reviewed with patient 2. rx as per orders above; reviewed possible side effects, interactions, risks and benefits; rx for z-pak and cough syrup as per orders 3. Recommend supportive treatment with home albuterol inhaler prn 4. Follow-up prn if symptoms worsen or don't improve   Norval Gable, MD 07/16/16 2030    Norval Gable, MD 07/16/16 2031

## 2016-07-19 LAB — CULTURE, GROUP A STREP (THRC)

## 2016-10-26 IMAGING — CT CT ABD-PELV W/ CM
1 of 3 series · 13 of 32 positions shown, 18 images · IV contrast (omnipaque)
Comparison: Right upper quadrant ultrasound dated 06/24/2015
abdominal CT dated 12/06/2014

CLINICAL DATA: 49-year-old female with abdominal pain extending
from the center of the abdomen into the right upper quadrant.

EXAM:
CT ABDOMEN AND PELVIS WITH CONTRAST
TECHNIQUE: Multidetector CT imaging of the abdomen and pelvis was performed
using the standard protocol following bolus administration of
intravenous contrast.
CONTRAST:  80mL OMNIPAQUE IOHEXOL 300 MG/ML  SOLN

[Series 2: routine abd pel with · axial · 0.62mm/px · z∈[-435,-50]mm · 13 of 87 slices shown, 18 images]
[im 5/87  soft-tissue]
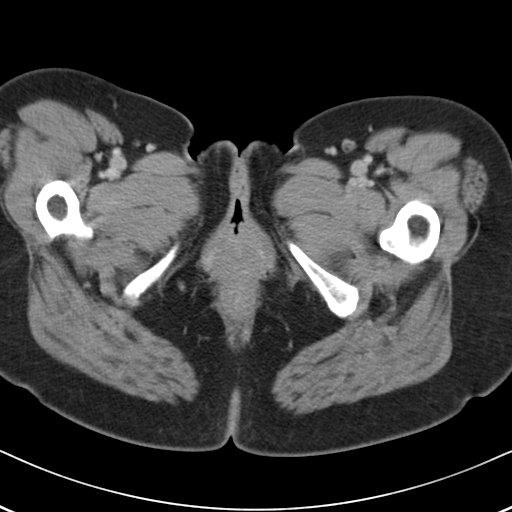
[im 5/87  bone]
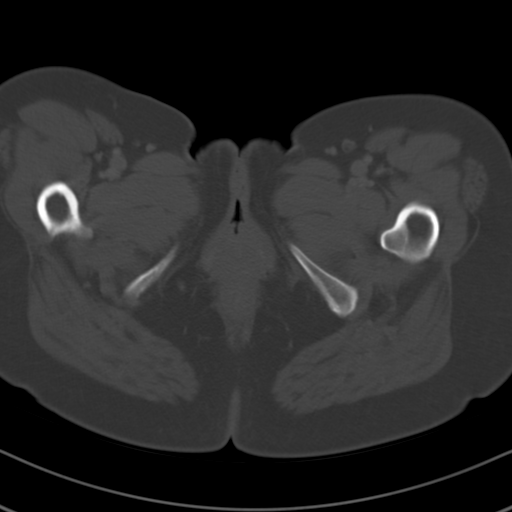
[im 14/87  soft-tissue]
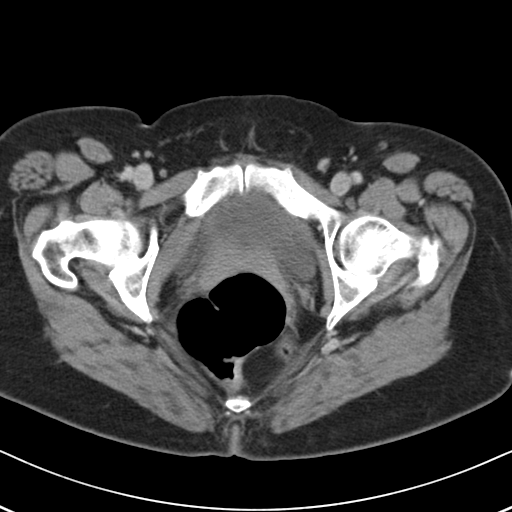
[im 19/87  soft-tissue]
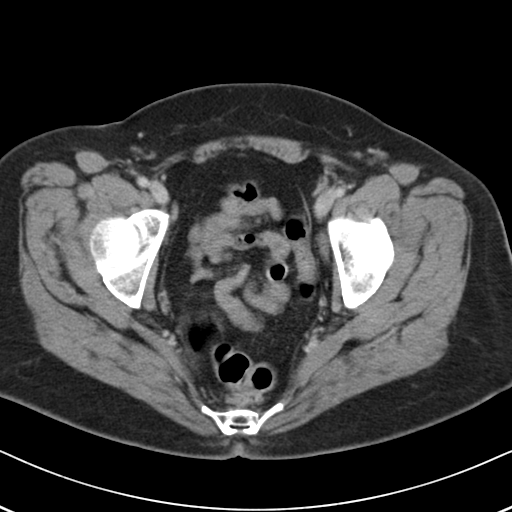
[im 28/87  soft-tissue]
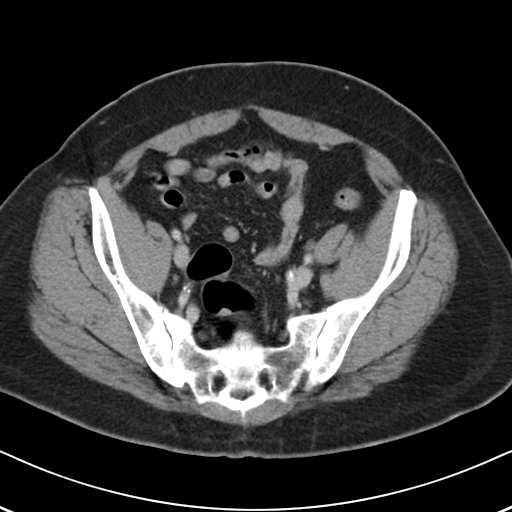
[im 32/87  soft-tissue]
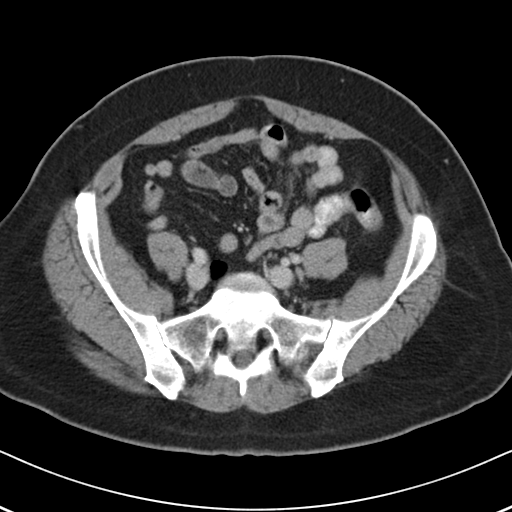
[im 41/87  soft-tissue]
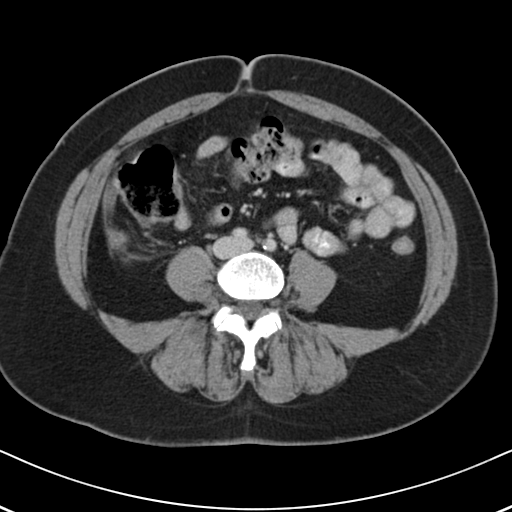
[im 46/87  soft-tissue]
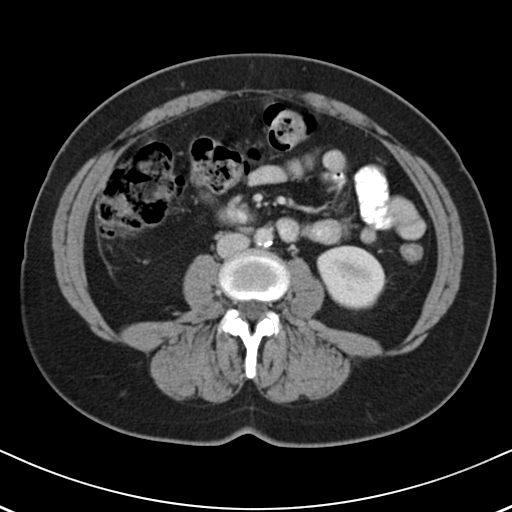
[im 55/87  soft-tissue]
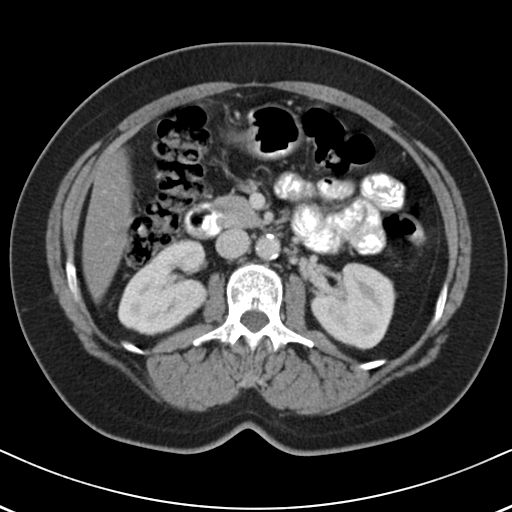
[im 59/87  soft-tissue]
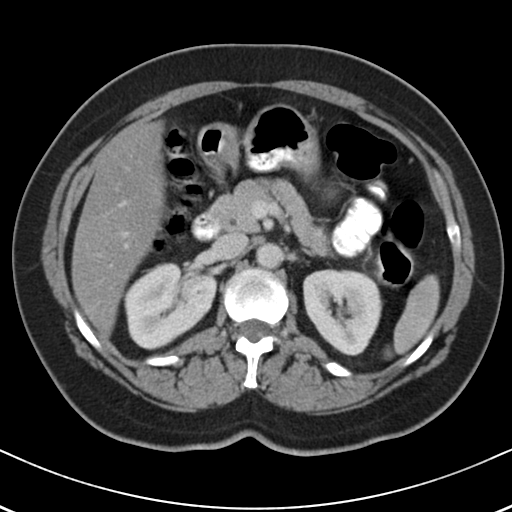
[im 59/87  bone]
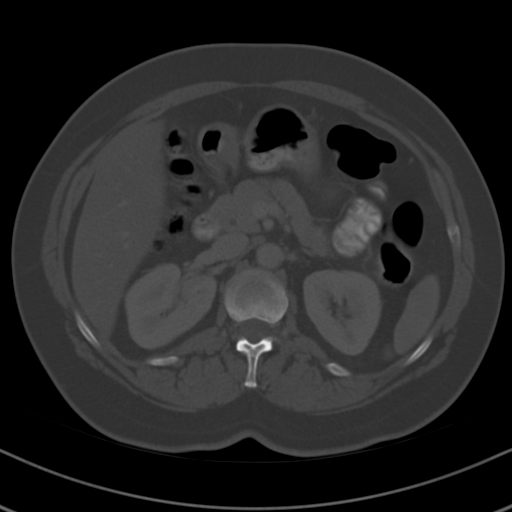
[im 68/87  soft-tissue]
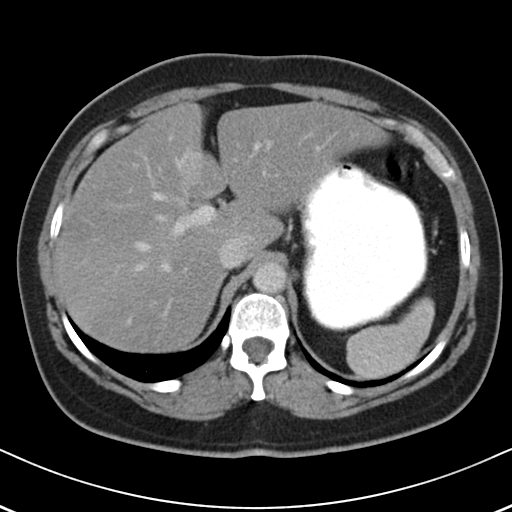
[im 68/87  lung]
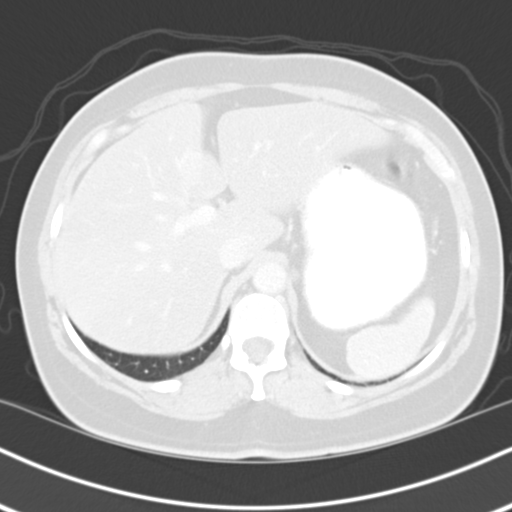
[im 73/87  soft-tissue]
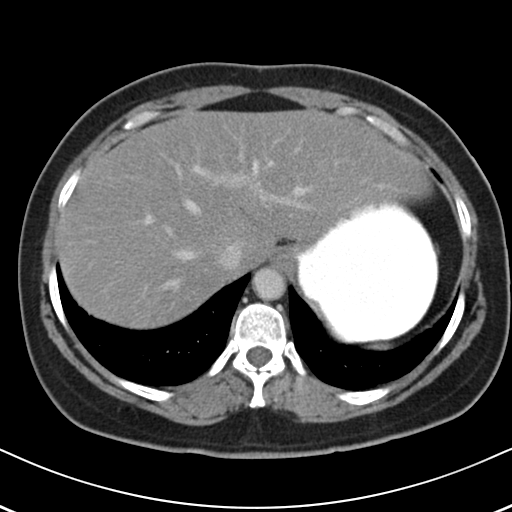
[im 73/87  lung]
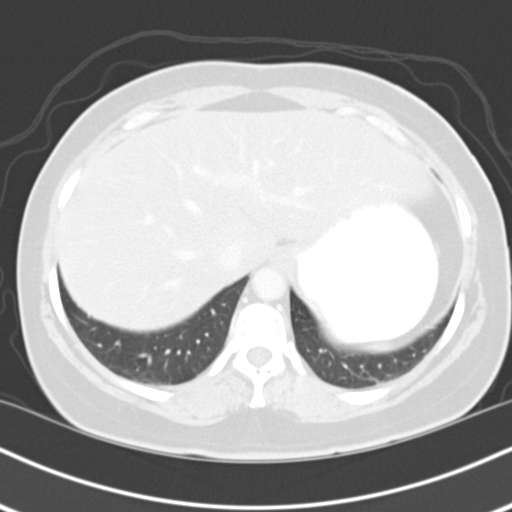
[im 77/87  lung]
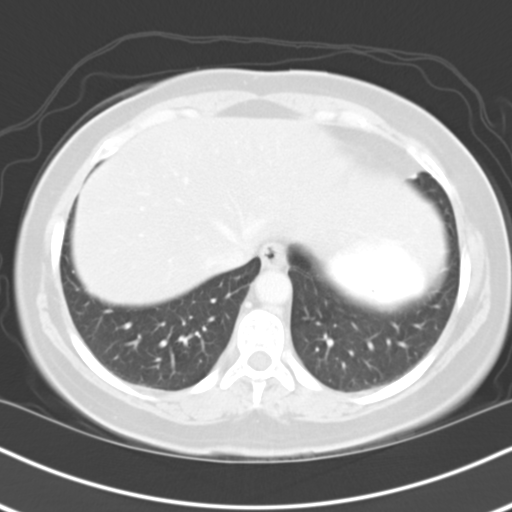
[im 82/87  soft-tissue]
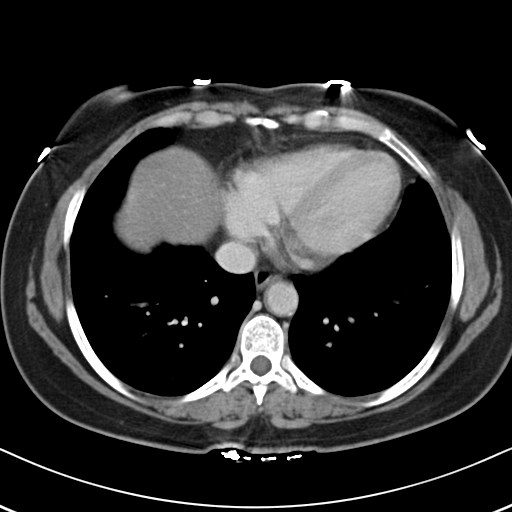
[im 82/87  lung]
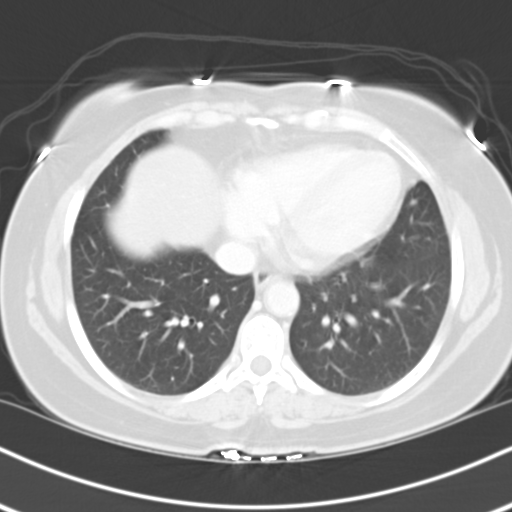

[13 of 32 positions shown; findings below may reference images not displayed]

FINDINGS: The visualized lung bases are clear. No intra-abdominal free air or
free fluid.

Diffuse hepatic steatosis. The gallbladder, pancreas, spleen, and
the adrenal glands appear unremarkable. A 1.4 x 1.9 cm grossly
stable appearing lesion is again noted in the medial upper pole of
the left kidney. This lesion was seen on the prior CT as well as of
the ultrasound and is not well characterized. MRI without and with
contrast, as previously recommended, is advised for further
characterization. There is no hydronephrosis on either side. The
visualized ureters appear unremarkable. The urinary bladder is
collapsed. The uterus is not visualized, likely surgically absent.

Oral contrast opacifies the stomach and loops of small bowel without
evidence of obstruction. There is moderate stool throughout the
colon. There is inflammatory changes of the appendix. High
attenuating content within the proximal appendix likely represent
appendicolith. The appendix measures approximately 8 mm in diameter.
It is located in the right lower quadrant lateral to the cecum. No
drainable fluid collection/abscess or evidence of perforation.

Mild aortoiliac atherosclerotic disease. The abdominal aorta and IVC
are patent. No portal venous gas identified. There is no adenopathy.
There is a retro aortic left renal vein anatomy. Midline vertical
anterior abdominal wall incisional scar. The osseous structures are
intact.
IMPRESSION: Acute appendicitis.  No abscess.

Indeterminate hypodense lesion from the superior pole of the left
kidney. MRI is recommended for further characterization.

## 2016-11-05 IMAGING — US US ABDOMEN COMPLETE
1 series · 13 of 25 positions shown · non-contrast
Comparison: 12/06/2014 CT abdomen/ pelvis. 06/18/2011 abdominal
sonogram.

CLINICAL DATA: Severe central/right upper quadrant abdominal pain
for several hours.

EXAM:
ABDOMEN ULTRASOUND COMPLETE

[Series 1: us abdomen complete · 0.17mm/px · 13 of 86 slices shown]
[im 1/86]
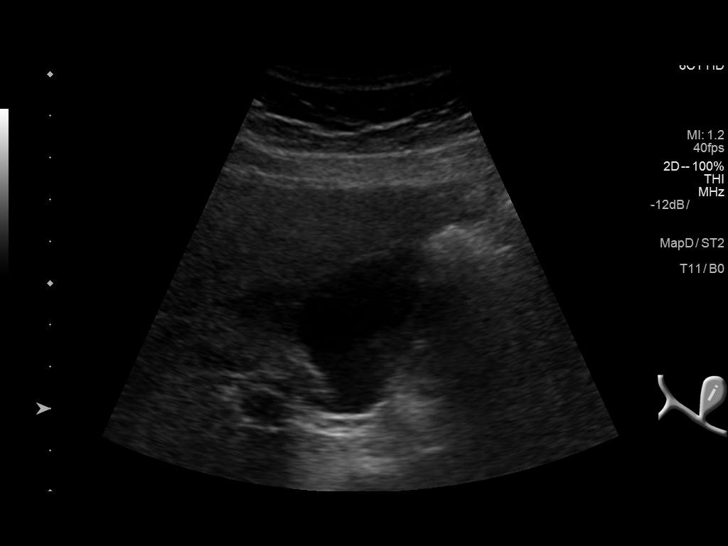
[im 8/86]
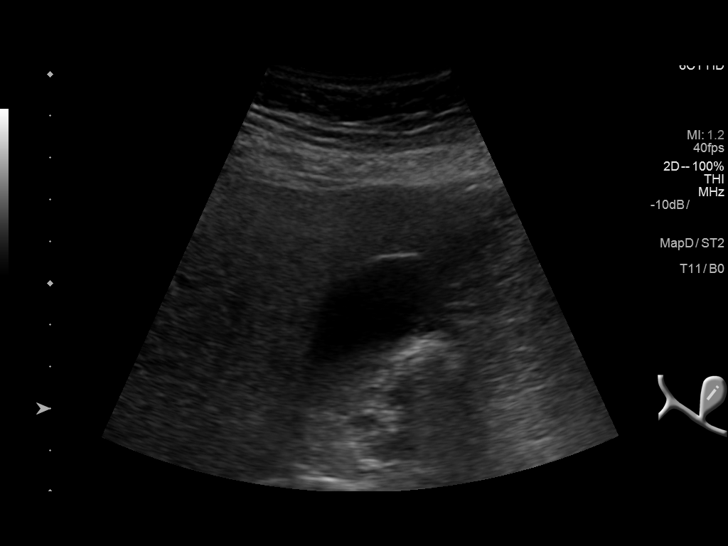
[im 15/86]
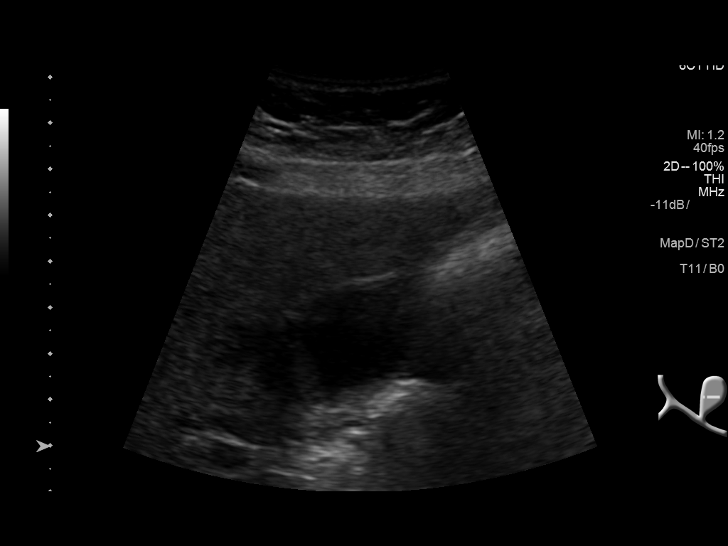
[im 22/86]
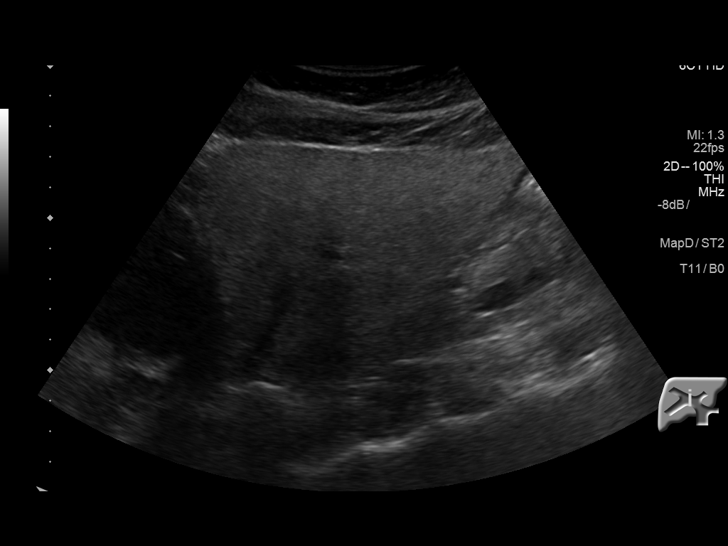
[im 29/86]
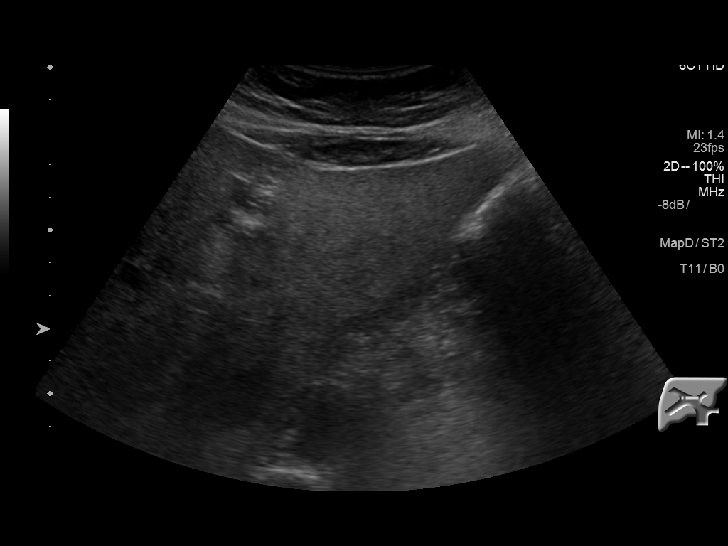
[im 36/86]
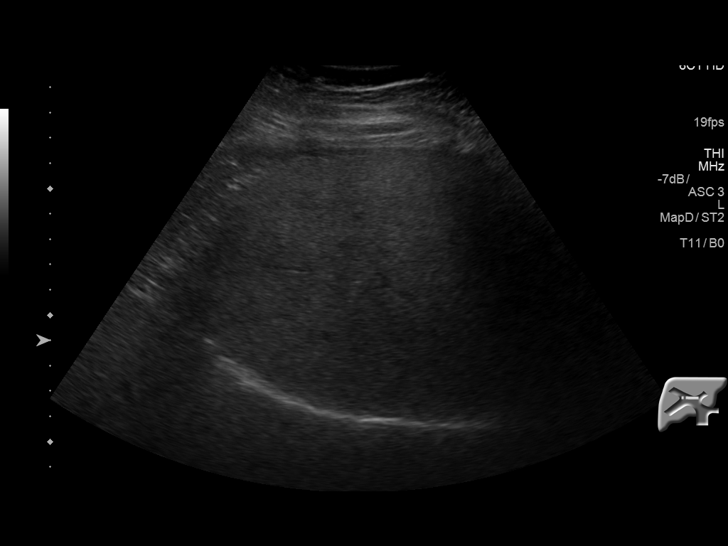
[im 43/86]
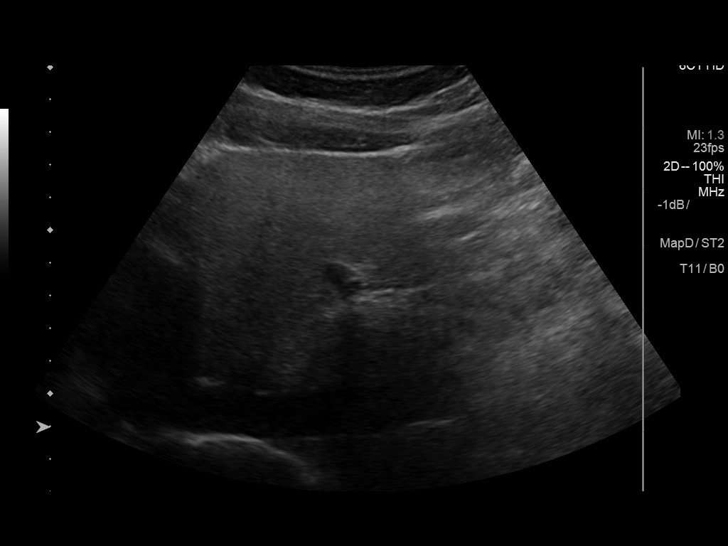
[im 50/86]
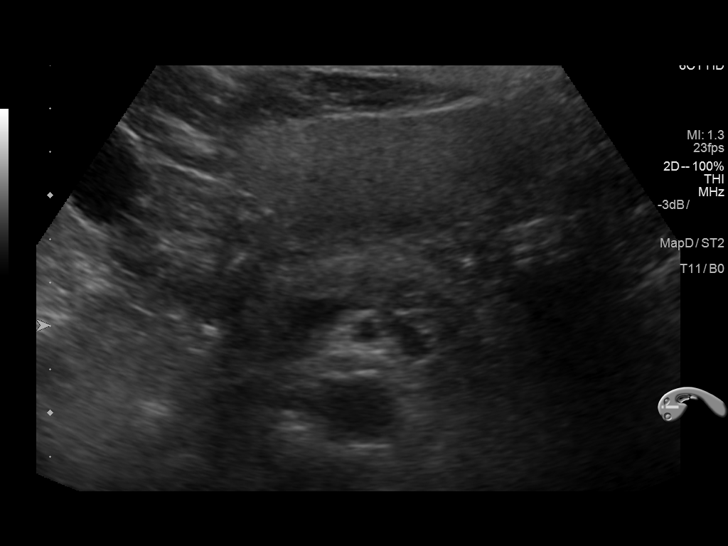
[im 57/86]
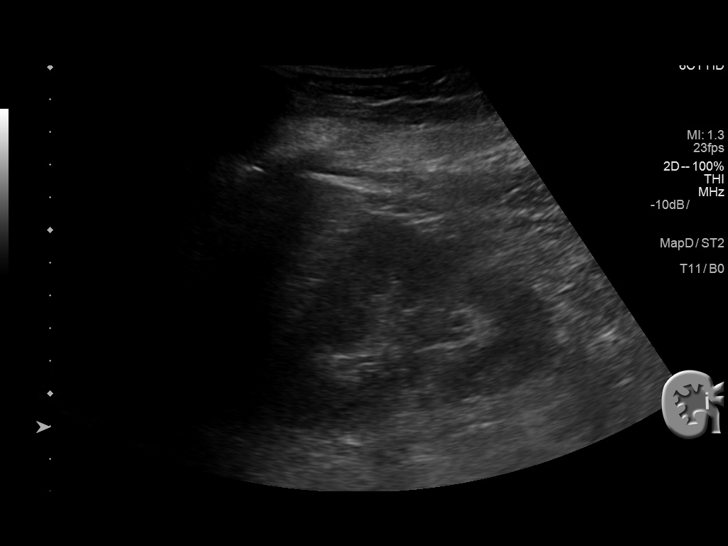
[im 64/86]
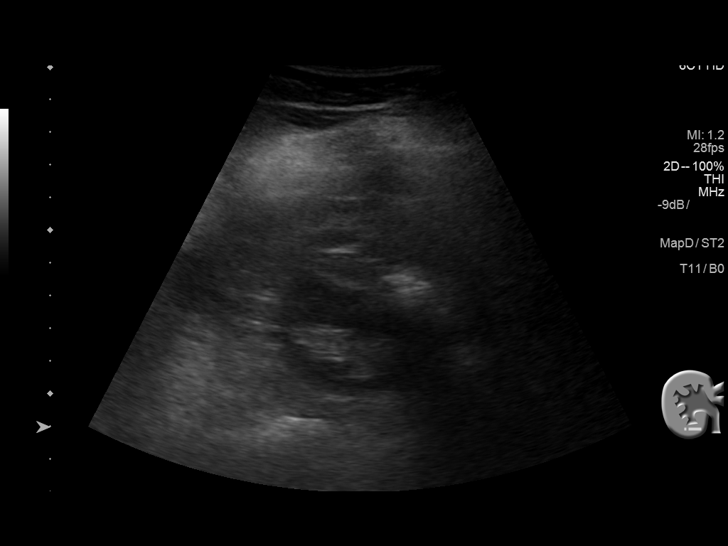
[im 71/86]
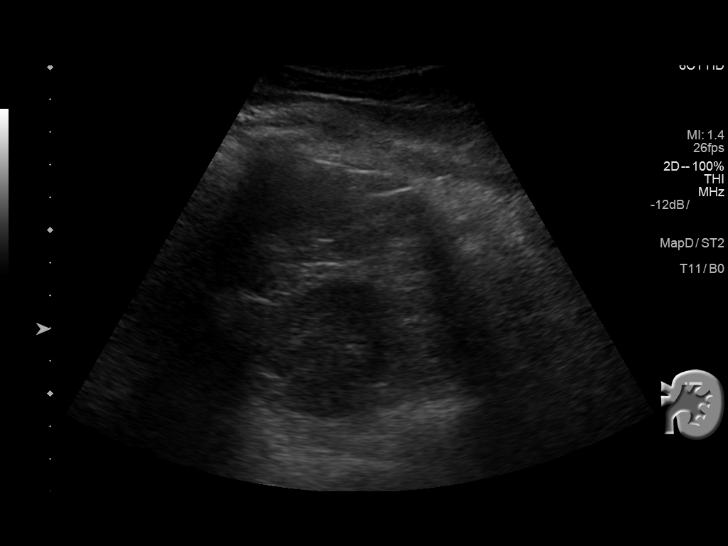
[im 78/86]
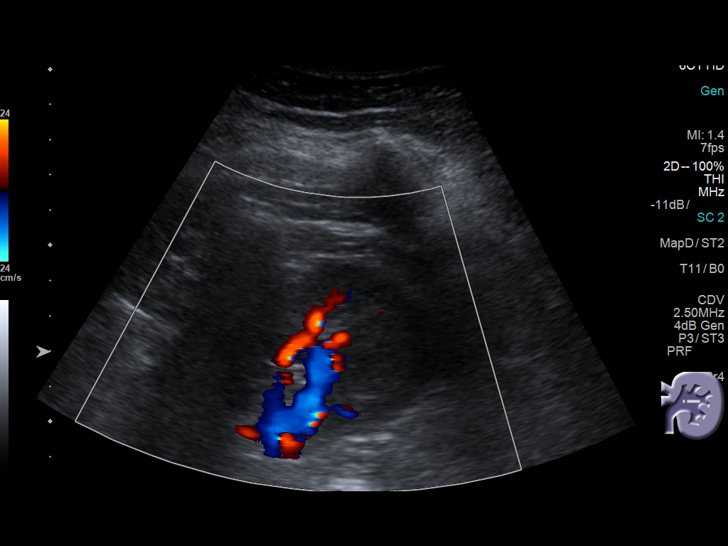
[im 86/86]
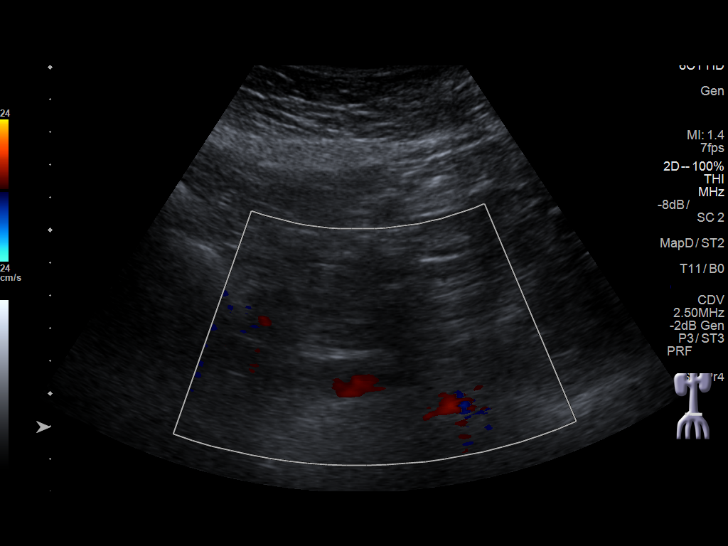

[13 of 25 positions shown; findings below may reference images not displayed]

FINDINGS: Gallbladder: There is layering sludge within the nondistended
gallbladder, with no gallstones, gallbladder wall thickening or
pericholecystic fluid. No sonographic Murphy's sign.

Common bile duct: Diameter: 4 mm

Liver: Liver parenchyma is diffusely markedly echogenic with
prominent posterior acoustic attenuation, in keeping with severe
diffuse hepatic steatosis. No definite liver surface irregularity is
demonstrated. No liver mass is detected, noting significantly
decreased sensitivity in the setting of severe steatosis.

IVC: No abnormality visualized.

Pancreas: Visualized portion unremarkable.

Spleen: Size and appearance within normal limits.

Right Kidney: Length: 10.6 cm. Echogenicity within normal limits. No
mass or hydronephrosis visualized.

Left Kidney: Length: 12.2 cm. Echogenicity within normal limits. No
hydronephrosis. There is a partially exophytic 2.8 x 1.5 x 1.8 cm
renal mass in the medial upper left kidney, which appears isoechoic
to the renal parenchyma and is indeterminate.

Abdominal aorta: No aneurysm visualized.

Other findings: None.
IMPRESSION: 1. Indeterminate 2.8 cm renal mass in the medial upper left kidney,
for which a renal neoplasm cannot be excluded. Further evaluation
with MRI abdomen with and without intravenous contrast is
recommended on a short term outpatient basis.
2. Gallbladder sludge. No gallstones. No evidence of acute
cholecystitis. No biliary ductal dilatation.
3. Severe diffuse hepatic steatosis.

## 2018-06-04 ENCOUNTER — Emergency Department
Admission: EM | Admit: 2018-06-04 | Discharge: 2018-06-04 | Disposition: A | Discharge status: Home / Self Care | Payer: BLUE CROSS/BLUE SHIELD | Attending: Emergency Medicine | Admitting: Emergency Medicine

## 2018-06-04 ENCOUNTER — Encounter: Payer: Self-pay | Admitting: *Deleted

## 2018-06-04 ENCOUNTER — Other Ambulatory Visit: Payer: Self-pay

## 2018-06-04 DIAGNOSIS — R112 Nausea with vomiting, unspecified: Secondary | ICD-10-CM | POA: Diagnosis present

## 2018-06-04 DIAGNOSIS — R197 Diarrhea, unspecified: Secondary | ICD-10-CM | POA: Insufficient documentation

## 2018-06-04 DIAGNOSIS — F17210 Nicotine dependence, cigarettes, uncomplicated: Secondary | ICD-10-CM | POA: Diagnosis not present

## 2018-06-04 DIAGNOSIS — Z79899 Other long term (current) drug therapy: Secondary | ICD-10-CM | POA: Insufficient documentation

## 2018-06-04 LAB — COMPREHENSIVE METABOLIC PANEL
ALT: 51 U/L — AB (ref 0–44)
AST: 49 U/L — ABNORMAL HIGH (ref 15–41)
Albumin: 4.8 g/dL (ref 3.5–5.0)
Alkaline Phosphatase: 79 U/L (ref 38–126)
Anion gap: 14 (ref 5–15)
BUN: 16 mg/dL (ref 6–20)
CHLORIDE: 106 mmol/L (ref 98–111)
CO2: 18 mmol/L — ABNORMAL LOW (ref 22–32)
Calcium: 9.6 mg/dL (ref 8.9–10.3)
Creatinine, Ser: 0.67 mg/dL (ref 0.44–1.00)
GFR calc non Af Amer: >60 mL/min (ref >60)
Glucose, Bld: 196 mg/dL — ABNORMAL HIGH (ref 70–99)
Potassium: 3.5 mmol/L (ref 3.5–5.1)
Sodium: 138 mmol/L (ref 135–145)
Total Bilirubin: 0.8 mg/dL (ref 0.3–1.2)
Total Protein: 8.5 g/dL — ABNORMAL HIGH (ref 6.5–8.1)

## 2018-06-04 LAB — URINALYSIS, COMPLETE (UACMP) WITH MICROSCOPIC
Bacteria, UA: NONE SEEN
Bilirubin Urine: NEGATIVE
Glucose, UA: 50 mg/dL — AB
Hgb urine dipstick: NEGATIVE
KETONES UR: NEGATIVE mg/dL
LEUKOCYTES UA: NEGATIVE
Nitrite: NEGATIVE
PROTEIN: NEGATIVE mg/dL
Specific Gravity, Urine: 1.025 (ref 1.005–1.030)
pH: 5 (ref 5.0–8.0)

## 2018-06-04 LAB — LIPASE, BLOOD: LIPASE: 37 U/L (ref 11–51)

## 2018-06-04 LAB — CBC
HCT: 49.3 % — ABNORMAL HIGH (ref 36.0–46.0)
Hemoglobin: 16.7 g/dL — ABNORMAL HIGH (ref 12.0–15.0)
MCH: 31.5 pg (ref 26.0–34.0)
MCHC: 33.9 g/dL (ref 30.0–36.0)
MCV: 93 fL (ref 80.0–100.0)
Platelets: 251 10*3/uL (ref 150–400)
RBC: 5.3 MIL/uL — ABNORMAL HIGH (ref 3.87–5.11)
RDW: 13 % (ref 11.5–15.5)
WBC: 19.8 10*3/uL — ABNORMAL HIGH (ref 4.0–10.5)
nRBC: 0 % (ref 0.0–0.2)

## 2018-06-04 MED ORDER — DICYCLOMINE HCL 10 MG PO CAPS
10.0000 mg | ORAL_CAPSULE | Freq: Once | ORAL | Status: AC
  Administered 2018-06-04: 10 mg via ORAL
  Filled 2018-06-04: qty 1

## 2018-06-04 MED ORDER — SODIUM CHLORIDE 0.9 % IV BOLUS
1000.0000 mL | Freq: Once | INTRAVENOUS | Status: AC
  Administered 2018-06-04: 1000 mL via INTRAVENOUS

## 2018-06-04 MED ORDER — ONDANSETRON HCL 4 MG/2ML IJ SOLN
4.0000 mg | Freq: Once | INTRAMUSCULAR | Status: AC
  Administered 2018-06-04: 4 mg via INTRAVENOUS
  Filled 2018-06-04: qty 2

## 2018-06-04 MED ORDER — ONDANSETRON 8 MG PO TBDP: 8.0000 mg | ORAL_TABLET | Freq: Three times a day (TID) | ORAL | 0 refills | Status: AC | PRN

## 2018-06-04 MED ORDER — FAMOTIDINE 20 MG PO TABS
20.0000 mg | ORAL_TABLET | Freq: Once | ORAL | Status: AC
  Administered 2018-06-04: 20 mg via ORAL
  Filled 2018-06-04: qty 1

## 2018-06-04 MED ORDER — FAMOTIDINE IN NACL 20-0.9 MG/50ML-% IV SOLN: 20.0000 mg | Freq: Once | INTRAVENOUS | Status: DC

## 2018-06-04 NOTE — ED Triage Notes (Signed)
Pt reports vomiting and diarrhea since about 1400. Pt took pepto bismol without relief. Pt has been in contact with other sick family members.

## 2018-06-04 NOTE — ED Provider Notes (Signed)
[facility] Emergency Department Provider Note ____________________________________________   First MD Initiated Contact with Patient 06/04/18 2036     (approximate)  I have reviewed the triage vital signs and the nursing notes.   HISTORY  Chief Complaint Emesis    HPI Erin Mercado is a 52 y.o. female with PMH as noted below who presents with nausea and vomiting over the last 6 hours or so, associated with diarrhea, both nonbloody.  She reports some crampy mild abdominal discomfort but no acute pain.  She denies fever.  The patient states that her grandson was sick with similar symptoms.  Past Medical History:  Diagnosis Date  . Anxiety   . Carotid artery stenosis, unilateral   . Heart murmur   . Hypercholesteremia   . Hyperglycemia   . Hypertension   . Hyperthyroidism   . Ovarian cancer ([place]) 1991   [facility]-chemo & oopherectomy  . Pancreatitis   . Sleep apnea   . Thyroid disease     Patient Active Problem List   Diagnosis Date Noted  . Fatty infiltration of liver 03/08/2015  . Chronic gastritis 03/06/2015  . Chronic diarrhea   . Acute pancreatitis 12/05/2014  . Kidney lump 09/24/2014  . Anxiety 03/02/2012  . H/O medication noncompliance 03/02/2012  . Cardiac murmur 03/02/2012  . H/O endocrine disorder 03/02/2012  . Hidradenitis 03/02/2012  . Irregular cardiac rhythm 03/02/2012  . Other specified symptoms and signs involving the circulatory and respiratory systems 03/02/2012  . Apnea, sleep 03/02/2012  . Avitaminosis D 03/02/2012  . Major depressive disorder, recurrent episode 07/31/2011    Class: Acute  . Adult hypothyroidism 07/15/2011  . Breast cyst 12/20/2010  . Benign essential HTN 12/20/2010  . H/O ovarian cancer 12/20/2010  . HLD (hyperlipidemia) 12/20/2010    Past Surgical History:  Procedure Laterality Date  . ABDOMINAL HYSTERECTOMY     complete   . ABSCESS DRAINAGE    . LAPAROSCOPIC APPENDECTOMY N/A 06/25/2015     Procedure: APPENDECTOMY LAPAROSCOPIC;  Surgeon: Dia Crawford III, MD;  Location: [facility] ORS;  Service: General;  Laterality: N/A;    Prior to Admission medications   Medication Sig Start Date End Date Taking? Authorizing Provider  albuterol (PROVENTIL HFA;VENTOLIN HFA) 108 (90 Base) MCG/ACT inhaler Inhale 1-2 puffs into the lungs every 6 (six) hours as needed for wheezing or shortness of breath. 04/13/16   Norval Gable, MD  ALPRAZolam Duanne Moron) 0.5 MG tablet Take 0.25 mg by mouth daily as needed for anxiety. Reported on 06/24/2015    [provider]  amLODipine (NORVASC) 10 MG tablet Take 1 tablet by mouth daily. 03/07/15 07/16/16  [provider]  azithromycin (ZITHROMAX Z-PAK) 250 MG tablet 2 tabs po once day 1, then 1 tab po qd for next 4 days 07/16/16   Norval Gable, MD  estradiol (CLIMARA - DOSED IN MG/24 HR) 0.075 mg/24hr Place 1 patch (0.075 mg total) onto the skin once a week. For estrogen replacement. SATURDAY Patient taking differently: Place 1 patch onto the skin once a week. Pt uses on Monday. 08/01/11   Darrol Jump, MD  guaiFENesin-codeine 100-10 MG/5ML syrup 10 ml po qhs prn cough 07/16/16   Norval Gable, MD  HYDROcodone-acetaminophen (NORCO/VICODIN) 5-325 MG tablet Take 1 tablet by mouth every 6 (six) hours as needed for moderate pain or severe pain. 11/14/15   Paulina Fusi, MD  lisinopril (PRINIVIL,ZESTRIL) 40 MG tablet Take 40 mg by mouth daily.    [provider]  losartan (COZAAR) 50  MG tablet Take 50 mg by mouth daily.    [provider]  metoprolol succinate (TOPROL-XL) 50 MG 24 hr tablet Take 1 tablet by mouth at bedtime. 05/14/15   [provider]  ondansetron (ZOFRAN ODT) 8 MG disintegrating tablet Take 1 tablet (8 mg total) by mouth every 8 (eight) hours as needed for up to 3 days for nausea or vomiting. 06/04/18 06/07/18  Arta Silence, MD  predniSONE (DELTASONE) 20 MG tablet Take 1 tablet (20 mg total) by mouth daily. 04/13/16    Norval Gable, MD  sertraline (ZOLOFT) 50 MG tablet Take 50 mg by mouth daily.    [provider]    Allergies Patient has no known allergies.  Family History  Problem Relation Age of Onset  . Hypertension Father   . Diabetes Father   . Colon cancer Neg Hx   . Liver disease Neg Hx     Social History Social History   Tobacco Use  . Smoking status: Current Every Day Smoker    Packs/day: 1.00    Years: 27.00    Pack years: 27.00    Types: Cigarettes  . Smokeless tobacco: Never Used  Substance Use Topics  . Alcohol use: Not Currently    Alcohol/week: 0.0 standard drinks    Comment: rare, couple drinks per year  . Drug use: No    Review of Systems  Constitutional: No fever. Eyes: No redness. ENT: No sore throat. Cardiovascular: Denies chest pain. Respiratory: Denies shortness of breath. Gastrointestinal: Positive for nausea, vomiting, and diarrhea.  Genitourinary: Negative for dysuria.  Musculoskeletal: Negative for back pain. Skin: Negative for rash. Neurological: Negative for headache.   ____________________________________________   PHYSICAL EXAM:  VITAL SIGNS: ED Triage Vitals  Enc Vitals Group     BP 06/04/18 1908 (!) 139/95     Pulse Rate 06/04/18 1908 (!) 123     Resp 06/04/18 1908 16     Temp 06/04/18 1908 97.9 F (36.6 C)     Temp Source 06/04/18 1908 Oral     SpO2 06/04/18 1908 97 %     Weight 06/04/18 1909 140 lb (63.5 kg)     Height 06/04/18 1909 4\' 10"  (1.473 m)     Head Circumference --      Peak Flow --      Pain Score 06/04/18 1906 0     Pain Loc --      Pain Edu? --      Excl. in [place]? --     Constitutional: Alert and oriented.  Relatively well appearing and in no acute distress. Eyes: Conjunctivae are normal.  No scleral icterus. Head: Atraumatic. Nose: No congestion/rhinnorhea. Mouth/Throat: Mucous membranes are dry.   Neck: Normal range of motion.  Cardiovascular: Tachycardic, regular rhythm.  Good peripheral  circulation. Respiratory: Normal respiratory effort.  Gastrointestinal: Soft with mild discomfort but no focal tenderness. No distention.  Genitourinary: No flank tenderness. Musculoskeletal:  Extremities warm and well perfused.  Neurologic:  Normal speech and language. No gross focal neurologic deficits are appreciated.  Skin:  Skin is warm and dry. No rash noted. Psychiatric: Mood and affect are normal. Speech and behavior are normal.  ____________________________________________   LABS (all labs ordered are listed, but only abnormal results are displayed)  Labs Reviewed  COMPREHENSIVE METABOLIC PANEL - Abnormal; Notable for the following components:      Result Value   CO2 18 (*)    Glucose, Bld 196 (*)    Total Protein 8.5 (*)  AST 49 (*)    ALT 51 (*)    All other components within normal limits  CBC - Abnormal; Notable for the following components:   WBC 19.8 (*)    RBC 5.30 (*)    Hemoglobin 16.7 (*)    HCT 49.3 (*)    All other components within normal limits  URINALYSIS, COMPLETE (UACMP) WITH MICROSCOPIC - Abnormal; Notable for the following components:   Color, Urine YELLOW (*)    APPearance CLEAR (*)    Glucose, UA 50 (*)    All other components within normal limits  LIPASE, BLOOD   ____________________________________________  EKG   ____________________________________________  RADIOLOGY    ____________________________________________   PROCEDURES  Procedure(s) performed: No  Procedures  Critical Care performed: No ____________________________________________   INITIAL IMPRESSION / ASSESSMENT AND PLAN / ED COURSE  Pertinent labs & imaging results that were available during my care of the patient were reviewed by me and considered in my medical decision making (see chart for details).  52 year old female with PMH as noted above presents with nausea, vomiting, and diarrhea over the course the day today with multiple episodes of both.  The  patient states that her mouth is dry and she feels weak and dehydrated.  She denies any active abdominal pain.  On exam, the patient is relatively well-appearing.  She is tachycardic but her other vital signs are normal.  The abdomen is soft with no focal tenderness.  Presentation is consistent with viral gastroenteritis or foodborne illness and the patient did have a sick contact in the family.  Given her tachycardia and dry mucous membranes we will give IV fluids, as well as obtaining labs and treating symptomatically with Zofran.  I anticipate discharge home.  ----------------------------------------- 11:38 PM on 06/04/2018 -----------------------------------------  The lab work-up is unremarkable except for slightly low bicarb which is consistent with dehydration and vomiting, and elevated WBC count consistent with viral infection.  The patient is feeling much better after the fluids and medicine.  She is tolerating p.o.  She feels comfortable and would like to go home.  She does report some crampy abdominal discomfort still, so I will give a dose of Bentyl and Pepcid.  I gave the patient thorough return precautions and she expressed understanding.  ____________________________________________   FINAL CLINICAL IMPRESSION(S) / ED DIAGNOSES  Final diagnoses:  Nausea vomiting and diarrhea      NEW MEDICATIONS STARTED DURING THIS VISIT:  New Prescriptions   ONDANSETRON (ZOFRAN ODT) 8 MG DISINTEGRATING TABLET    Take 1 tablet (8 mg total) by mouth every 8 (eight) hours as needed for up to 3 days for nausea or vomiting.     Note:  This document was prepared using Dragon voice recognition software and may include unintentional dictation errors.    Arta Silence, MD 06/04/18 [phone]

## 2018-06-04 NOTE — ED Notes (Signed)
Reviewed discharge instructions, follow-up care, and prescriptions with patient. Patient verbalized understanding of all information reviewed. Patient stable, with no distress noted at this time.    

## 2018-06-04 NOTE — Discharge Instructions (Addendum)
Return to the ER for new, worsening, persistent severe vomiting, abdominal pain, fever, weakness, or any other new or worsening symptoms that concern you.

## 2020-11-06 ENCOUNTER — Ambulatory Visit
Admission: EM | Admit: 2020-11-06 | Discharge: 2020-11-06 | Disposition: A | Discharge status: Home / Self Care | Payer: BC Managed Care – PPO

## 2020-11-06 ENCOUNTER — Other Ambulatory Visit: Payer: Self-pay

## 2020-11-06 DIAGNOSIS — L0231 Cutaneous abscess of buttock: Secondary | ICD-10-CM

## 2020-11-06 MED ORDER — DOXYCYCLINE HYCLATE 100 MG PO CAPS: 100.0000 mg | ORAL_CAPSULE | Freq: Two times a day (BID) | ORAL | 0 refills | Status: DC

## 2020-11-06 MED ORDER — HYDROCODONE-ACETAMINOPHEN 5-325 MG PO TABS: 2.0000 | ORAL_TABLET | Freq: Four times a day (QID) | ORAL | 0 refills | Status: AC | PRN

## 2020-11-06 MED ORDER — DOXYCYCLINE HYCLATE 100 MG PO CAPS: 100.0000 mg | ORAL_CAPSULE | Freq: Two times a day (BID) | ORAL | 0 refills | Status: AC

## 2020-11-06 NOTE — Discharge Instructions (Signed)
Apply heat on area for 15 minutes at a time 3-5 times per day for 5-7 days Come back if the area gets larger to get it drained

## 2020-11-06 NOTE — ED Triage Notes (Signed)
Pt presents with abscess on left buttock for past few days
